# Patient Record
Sex: Female | Born: 1935 | Race: Black or African American | Hispanic: No | Marital: Single | Smoking: Never smoker
Health system: Southern US, Community
[De-identification: ages and names within clinical notes are randomized; demographics above are authoritative.]

## PROBLEM LIST (undated history)

## (undated) DIAGNOSIS — E785 Hyperlipidemia, unspecified: Secondary | ICD-10-CM

## (undated) DIAGNOSIS — E0590 Thyrotoxicosis, unspecified without thyrotoxic crisis or storm: Secondary | ICD-10-CM

## (undated) DIAGNOSIS — R0989 Other specified symptoms and signs involving the circulatory and respiratory systems: Secondary | ICD-10-CM

## (undated) DIAGNOSIS — E042 Nontoxic multinodular goiter: Secondary | ICD-10-CM

## (undated) DIAGNOSIS — K219 Gastro-esophageal reflux disease without esophagitis: Secondary | ICD-10-CM

## (undated) DIAGNOSIS — R002 Palpitations: Secondary | ICD-10-CM

## (undated) DIAGNOSIS — M1990 Unspecified osteoarthritis, unspecified site: Secondary | ICD-10-CM

## (undated) DIAGNOSIS — N183 Chronic kidney disease, stage 3 unspecified: Secondary | ICD-10-CM

## (undated) DIAGNOSIS — I8390 Asymptomatic varicose veins of unspecified lower extremity: Secondary | ICD-10-CM

## (undated) DIAGNOSIS — I10 Essential (primary) hypertension: Secondary | ICD-10-CM

## (undated) HISTORY — DX: Chronic kidney disease, stage 3 unspecified: N18.30

## (undated) HISTORY — DX: Chronic kidney disease, stage 3 (moderate): N18.3

## (undated) HISTORY — DX: Nontoxic multinodular goiter: E04.2

## (undated) HISTORY — DX: Palpitations: R00.2

## (undated) HISTORY — PX: ABDOMINAL HYSTERECTOMY: SHX81

## (undated) HISTORY — DX: Asymptomatic varicose veins of unspecified lower extremity: I83.90

## (undated) HISTORY — DX: Other specified symptoms and signs involving the circulatory and respiratory systems: R09.89

## (undated) HISTORY — PX: OTHER SURGICAL HISTORY: SHX169

## (undated) HISTORY — PX: TONSILLECTOMY: SUR1361

---

## 1999-07-02 ENCOUNTER — Other Ambulatory Visit: Admission: RE | Admit: 1999-07-02 | Discharge: 1999-07-02 | Payer: Self-pay | Admitting: Obstetrics and Gynecology

## 1999-07-29 ENCOUNTER — Encounter: Payer: Self-pay | Admitting: Obstetrics and Gynecology

## 1999-07-29 ENCOUNTER — Encounter: Admission: RE | Admit: 1999-07-29 | Discharge: 1999-07-29 | Payer: Self-pay | Admitting: Obstetrics and Gynecology

## 2000-07-31 ENCOUNTER — Other Ambulatory Visit: Admission: RE | Admit: 2000-07-31 | Discharge: 2000-07-31 | Payer: Self-pay | Admitting: Obstetrics and Gynecology

## 2000-08-11 ENCOUNTER — Encounter: Payer: Self-pay | Admitting: Obstetrics and Gynecology

## 2000-08-11 ENCOUNTER — Encounter: Admission: RE | Admit: 2000-08-11 | Discharge: 2000-08-11 | Payer: Self-pay | Admitting: Obstetrics and Gynecology

## 2000-09-23 ENCOUNTER — Encounter: Payer: Self-pay | Admitting: Obstetrics and Gynecology

## 2000-09-23 ENCOUNTER — Encounter: Admission: RE | Admit: 2000-09-23 | Discharge: 2000-09-23 | Payer: Self-pay | Admitting: Obstetrics and Gynecology

## 2001-08-12 ENCOUNTER — Other Ambulatory Visit: Admission: RE | Admit: 2001-08-12 | Discharge: 2001-08-12 | Payer: Self-pay | Admitting: Obstetrics and Gynecology

## 2002-10-19 ENCOUNTER — Ambulatory Visit (HOSPITAL_COMMUNITY): Admission: RE | Admit: 2002-10-19 | Discharge: 2002-10-19 | Payer: Self-pay | Admitting: Gastroenterology

## 2002-10-19 ENCOUNTER — Encounter (INDEPENDENT_AMBULATORY_CARE_PROVIDER_SITE_OTHER): Payer: Self-pay | Admitting: Specialist

## 2003-08-28 ENCOUNTER — Other Ambulatory Visit: Admission: RE | Admit: 2003-08-28 | Discharge: 2003-08-28 | Payer: Self-pay | Admitting: Obstetrics and Gynecology

## 2003-09-08 ENCOUNTER — Encounter: Admission: RE | Admit: 2003-09-08 | Discharge: 2003-09-08 | Payer: Self-pay | Admitting: Surgery

## 2006-12-08 ENCOUNTER — Encounter: Admission: RE | Admit: 2006-12-08 | Discharge: 2006-12-08 | Payer: Self-pay | Admitting: Obstetrics and Gynecology

## 2008-09-14 ENCOUNTER — Encounter: Admission: RE | Admit: 2008-09-14 | Discharge: 2008-09-14 | Payer: Self-pay | Admitting: Family Medicine

## 2009-03-02 ENCOUNTER — Encounter: Admission: RE | Admit: 2009-03-02 | Discharge: 2009-03-02 | Payer: Self-pay | Admitting: Family Medicine

## 2009-09-05 ENCOUNTER — Encounter: Admission: RE | Admit: 2009-09-05 | Discharge: 2009-09-05 | Payer: Self-pay | Admitting: Endocrinology

## 2010-03-01 ENCOUNTER — Encounter
Admission: RE | Admit: 2010-03-01 | Discharge: 2010-03-01 | Payer: Self-pay | Source: Home / Self Care | Attending: Endocrinology | Admitting: Endocrinology

## 2010-07-05 NOTE — Op Note (Signed)
   NAME:  Tammy Page, Tammy Page                        ACCOUNT NO.:  [id number]   MEDICAL RECORD NO.:  [id number]                   PATIENT TYPE:  AMB   LOCATION:  ENDO                                 FACILITY:  [facility]   PHYSICIAN:  John C. Madilyn Fireman, M.D.                 DATE OF BIRTH:  30-Jan-1933   DATE OF PROCEDURE:  10/19/2002  DATE OF DISCHARGE:                                 OPERATIVE REPORT   PROCEDURE:  Colonoscopy with polypectomy.   INDICATION FOR PROCEDURE:  Colon cancer screening in a 76 year old patient  with no prior screening.   DESCRIPTION OF PROCEDURE:  The patient was placed in the left lateral  decubitus position and placed on the pulse monitor with continuous low-flow  oxygen delivered by nasal cannula.  She was sedated with 62.5 mcg IV  fentanyl and 5 mg IV Versed.  The Olympus video colonoscope was inserted  into the rectum and advanced to the cecum, confirmed by transillumination at  McBurney's point and visualization of the ileocecal valve and appendiceal  orifice.  The prep was excellent.  Just proximal to the ileocecal valve  there was a 1 cm sessile polyp that was removed by snare.  The remainder of  the cecum, ascending, transverse, descending, and sigmoid colon all appeared  normal with no further masses, polyps, diverticula, or other mucosal  abnormalities.  The rectum likewise appeared normal, and retroflexed view of  the anus revealed no obvious internal hemorrhoids.  The scope was then  withdrawn and the patient returned to the recovery room in stable condition.  She tolerated the procedure well, and there were no immediate complications.   IMPRESSION:  Cecal polyp, otherwise normal study.   PLAN:  Await histology to determine method and interval for future colon  screening.                                               John C. Madilyn Fireman, M.D.    [facility]/MEDQ  D:  10/19/2002  T:  10/20/2002  Job:  [id number]   cc:   Duke Salvia. Marcelle Overlie, M.D.  [address], [address] [place]  [place] [address]  Fax: [phone]

## 2010-08-29 ENCOUNTER — Other Ambulatory Visit: Payer: Self-pay | Admitting: Endocrinology

## 2010-08-29 DIAGNOSIS — E049 Nontoxic goiter, unspecified: Secondary | ICD-10-CM

## 2010-09-03 ENCOUNTER — Other Ambulatory Visit: Payer: Self-pay

## 2010-09-05 ENCOUNTER — Ambulatory Visit
Admission: RE | Admit: 2010-09-05 | Discharge: 2010-09-05 | Disposition: A | Discharge status: Home / Self Care | Payer: Medicare Other | Source: Ambulatory Visit | Attending: Endocrinology | Admitting: Endocrinology

## 2010-09-05 DIAGNOSIS — E049 Nontoxic goiter, unspecified: Secondary | ICD-10-CM

## 2010-09-11 ENCOUNTER — Other Ambulatory Visit: Payer: Self-pay | Admitting: Endocrinology

## 2010-09-11 DIAGNOSIS — E042 Nontoxic multinodular goiter: Secondary | ICD-10-CM

## 2010-09-18 ENCOUNTER — Ambulatory Visit
Admission: RE | Admit: 2010-09-18 | Discharge: 2010-09-18 | Disposition: A | Discharge status: Home / Self Care | Payer: Medicare Other | Source: Ambulatory Visit | Attending: Endocrinology | Admitting: Endocrinology

## 2010-09-18 ENCOUNTER — Other Ambulatory Visit (HOSPITAL_COMMUNITY)
Admission: RE | Admit: 2010-09-18 | Discharge: 2010-09-18 | Disposition: A | Discharge status: Home / Self Care | Payer: Medicare Other | Source: Ambulatory Visit | Attending: Interventional Radiology | Admitting: Interventional Radiology

## 2010-09-18 DIAGNOSIS — E042 Nontoxic multinodular goiter: Secondary | ICD-10-CM

## 2010-09-18 DIAGNOSIS — E049 Nontoxic goiter, unspecified: Secondary | ICD-10-CM | POA: Insufficient documentation

## 2010-10-14 ENCOUNTER — Other Ambulatory Visit: Payer: Self-pay | Admitting: Family Medicine

## 2010-10-14 ENCOUNTER — Ambulatory Visit
Admission: RE | Admit: 2010-10-14 | Discharge: 2010-10-14 | Disposition: A | Discharge status: Home / Self Care | Payer: Medicare Other | Source: Ambulatory Visit | Attending: Family Medicine | Admitting: Family Medicine

## 2010-10-14 DIAGNOSIS — R609 Edema, unspecified: Secondary | ICD-10-CM

## 2011-01-24 ENCOUNTER — Other Ambulatory Visit: Payer: Self-pay

## 2011-01-24 ENCOUNTER — Encounter: Payer: Self-pay | Admitting: Physical Medicine and Rehabilitation

## 2011-01-24 ENCOUNTER — Emergency Department (HOSPITAL_COMMUNITY)
Admission: EM | Admit: 2011-01-24 | Discharge: 2011-01-24 | Disposition: A | Discharge status: Home / Self Care | Payer: Medicare Other | Attending: Emergency Medicine | Admitting: Emergency Medicine

## 2011-01-24 DIAGNOSIS — E559 Vitamin D deficiency, unspecified: Secondary | ICD-10-CM | POA: Insufficient documentation

## 2011-01-24 DIAGNOSIS — M79609 Pain in unspecified limb: Secondary | ICD-10-CM | POA: Insufficient documentation

## 2011-01-24 DIAGNOSIS — M1990 Unspecified osteoarthritis, unspecified site: Secondary | ICD-10-CM | POA: Insufficient documentation

## 2011-01-24 DIAGNOSIS — R0602 Shortness of breath: Secondary | ICD-10-CM | POA: Insufficient documentation

## 2011-01-24 DIAGNOSIS — E785 Hyperlipidemia, unspecified: Secondary | ICD-10-CM | POA: Insufficient documentation

## 2011-01-24 DIAGNOSIS — R079 Chest pain, unspecified: Secondary | ICD-10-CM | POA: Insufficient documentation

## 2011-01-24 DIAGNOSIS — R002 Palpitations: Secondary | ICD-10-CM | POA: Insufficient documentation

## 2011-01-24 DIAGNOSIS — I480 Paroxysmal atrial fibrillation: Secondary | ICD-10-CM

## 2011-01-24 DIAGNOSIS — I10 Essential (primary) hypertension: Secondary | ICD-10-CM | POA: Insufficient documentation

## 2011-01-24 DIAGNOSIS — R42 Dizziness and giddiness: Secondary | ICD-10-CM | POA: Insufficient documentation

## 2011-01-24 DIAGNOSIS — I4891 Unspecified atrial fibrillation: Secondary | ICD-10-CM | POA: Insufficient documentation

## 2011-01-24 DIAGNOSIS — M542 Cervicalgia: Secondary | ICD-10-CM | POA: Insufficient documentation

## 2011-01-24 DIAGNOSIS — K219 Gastro-esophageal reflux disease without esophagitis: Secondary | ICD-10-CM | POA: Insufficient documentation

## 2011-01-24 DIAGNOSIS — H409 Unspecified glaucoma: Secondary | ICD-10-CM | POA: Insufficient documentation

## 2011-01-24 HISTORY — DX: Unspecified osteoarthritis, unspecified site: M19.90

## 2011-01-24 HISTORY — DX: Gastro-esophageal reflux disease without esophagitis: K21.9

## 2011-01-24 HISTORY — DX: Hyperlipidemia, unspecified: E78.5

## 2011-01-24 HISTORY — DX: Essential (primary) hypertension: I10

## 2011-01-24 LAB — URINALYSIS, ROUTINE W REFLEX MICROSCOPIC
Bilirubin Urine: NEGATIVE
Glucose, UA: NEGATIVE mg/dL
Hgb urine dipstick: NEGATIVE
Leukocytes, UA: NEGATIVE
Nitrite: NEGATIVE
Specific Gravity, Urine: 1.006 (ref 1.005–1.030)
pH: 6.5 (ref 5.0–8.0)

## 2011-01-24 LAB — BASIC METABOLIC PANEL
Calcium: 11.2 mg/dL — ABNORMAL HIGH (ref 8.4–10.5)
Potassium: 4.3 mEq/L (ref 3.5–5.1)
Sodium: 142 mEq/L (ref 135–145)

## 2011-01-24 LAB — CARDIAC PANEL(CRET KIN+CKTOT+MB+TROPI)
CK, MB: 3 ng/mL (ref 0.3–4.0)
Relative Index: INVALID (ref 0.0–2.5)
Total CK: 62 U/L (ref 7–177)
Troponin I: <0.3 ng/mL (ref <0.30)

## 2011-01-24 LAB — CBC
Hemoglobin: 13 g/dL (ref 12.0–15.0)
MCHC: 32.5 g/dL (ref 30.0–36.0)

## 2011-01-24 MED ORDER — SODIUM CHLORIDE 0.9 % IV BOLUS (SEPSIS)
500.0000 mL | Freq: Once | INTRAVENOUS | Status: AC
  Administered 2011-01-24: 500 mL via INTRAVENOUS

## 2011-01-24 MED ORDER — METOPROLOL TARTRATE 50 MG PO TABS: 25.0000 mg | ORAL_TABLET | Freq: Two times a day (BID) | ORAL | Status: DC

## 2011-01-24 NOTE — ED Notes (Signed)
Discharge instructions reviewed;  Verbalizes understanding.  Pt to lobby via wheelchair.  NAD noted.  VSS.

## 2011-01-24 NOTE — ED Notes (Signed)
Pt presents to department for evaluation of heart palpitations. Onset Wednesday while at home. Pt states "I feel like my heart is beating out of my chest." pt also states generalized weakness and fatigue. Respirations unlabored at the time. She is alert and oriented x4. Skin warm and dry.

## 2011-01-24 NOTE — ED Provider Notes (Signed)
History     CSN: [id number] Arrival date & time: 01/24/2011  4:51 PM   First MD Initiated Contact with Patient 01/24/11 1714      Chief Complaint  Patient presents with  . Chest Pain    (Consider location/radiation/quality/duration/timing/severity/associated sxs/prior treatment) The history is provided by the patient. The history is limited by the condition of the patient.   the patient is a 77 year old female, who presents to the emergency department complaining of palpitations in her chest that began on Wednesday.  She also had right arm pain, and right neck pain, associated with the palpitations.  She felt a little bit lightheaded and shortness of breath, as well.  She denies fainting.  She denies nausea, vomiting, diaphoresis, diarrhea, or urinary tract symptoms.  She denies leg pain or swelling. She does not smoke.  She denies drinking caffeinated beverages.  She has never had an irregular heartbeat in the past or congestive heart failure.  Prior to Wednesday.  She has never felt these symptoms before.  She denies recent illness.  She called her primary care physician, and was told to come to the emergency department for evaluation.  Past Medical History  Diagnosis Date  . Hypertension   . Glaucoma   . GERD (gastroesophageal reflux disease)   . Vitamin D deficiency   . Allergic rhinitis   . Hyperlipemia   . Osteoarthritis     No past surgical history on file.  No family history on file.  History  Substance Use Topics  . Smoking status: Never Smoker   . Smokeless tobacco: Not on file  . Alcohol Use: No    OB History    Grav Para Term Preterm Abortions TAB SAB Ect Mult Living                  Review of Systems  Constitutional: Negative for fever, chills and diaphoresis.  HENT: Negative for congestion and neck pain.   Eyes: Negative for redness.  Respiratory: Positive for shortness of breath. Negative for cough and chest tightness.   Cardiovascular: Positive for  chest pain and palpitations. Negative for leg swelling.  Gastrointestinal: Negative for nausea, vomiting, abdominal pain and diarrhea.  Genitourinary: Negative for dysuria.  Musculoskeletal: Negative for back pain.  Skin: Negative for rash.  Neurological: Positive for light-headedness. Negative for numbness and headaches.  Psychiatric/Behavioral: Negative for confusion.    Allergies  Review of patient's allergies indicates no known allergies.  Home Medications   Current Outpatient Rx  Name Route Sig Dispense Refill  . VITAMIN C 1000 MG PO TABS Oral Take 1,000 mg by mouth daily.      . ASPIRIN 81 MG PO TABS Oral Take 81 mg by mouth daily.     Marland Kitchen CALCIUM CARB-CHOLECALCIFEROL 600-400 MG-UNIT PO TABS Oral Take 1 tablet by mouth 2 (two) times daily.      Marland Kitchen VITAMIN D 1000 UNITS PO TABS Oral Take 2,000 Units by mouth daily.      Marland Kitchen DICLOFENAC SODIUM 1 % TD GEL Topical Apply 1 application topically 4 (four) times daily.        There were no vitals taken for this visit.  Physical Exam  Vitals reviewed. Constitutional: She is oriented to person, place, and time. She appears well-developed and well-nourished.  HENT:  Head: Normocephalic and atraumatic.  Eyes: Pupils are equal, round, and reactive to light.  Neck: Normal range of motion.  Cardiovascular: Regular rhythm and normal heart sounds.   No murmur heard.  Tachycardia  Pulmonary/Chest: Effort normal and breath sounds normal. No respiratory distress. She has no wheezes. She has no rales.  Abdominal: Soft. She exhibits no distension and no mass. There is no tenderness. There is no rebound and no guarding.  Musculoskeletal: Normal range of motion. She exhibits no edema and no tenderness.  Neurological: She is alert and oriented to person, place, and time. No cranial nerve deficit.  Skin: Skin is warm and dry. No rash noted. No erythema.  Psychiatric: She has a normal mood and affect. Her behavior is normal.    ED Course    Procedures (including critical care time)  Paroxysmal atrial fibrillation.  When I walked into the room.  She had a mild tachycardia on the monitor.  However, her heart rate was only 101.  P waves were evident.   Her sinus tachycardia represents a resolution of the atrial fibrillation, which she had on her presenting EKG at 1646.  We will perform laboratory testing, and keep her on the monitor.  There is no intervention indicated at this time.   Labs Reviewed  CBC  BASIC METABOLIC PANEL  URINALYSIS, ROUTINE W REFLEX MICROSCOPIC  CARDIAC PANEL(CRET KIN+CKTOT+MB+TROPI)     ED ECG REPORT   Date: 01/24/2011  EKG Time: 1646 Rate: 116  Rhythm: atrial fibrillation,   Axis: nl  Intervals:none  ST&T Change: no.     Narrative Interpretation:  Atrial fibrillation with rapid ventricular responsene     ED ECG REPORT   Date: 01/24/2011  EKG Time: 1738  Rate: 96  Rhythm: normal sinus rhythm,  Axis: nl  Intervals:none  ST&T Change: none  Narrative Interpretation: normal sinus rhythm            The patient remains in a normal sinus rhythm and asymptomatic. I spoke with the cardiologist, Dr. Dietrich Pates.  He agreed that the patient could go home.  He asked me to start a beta blocker twice a day and order a TSH which I have done.  He will arrange to have her evaluated in the office next Monday      MDM  Paroxysmal atrial fibrillation, resolved.  No signs of cardiac ischemia.  Asymptomatic        Nicholes Stairs, MD 01/24/11 1921

## 2011-01-25 LAB — TSH: TSH: <0.008 u[IU]/mL — ABNORMAL LOW (ref 0.350–4.500)

## 2011-01-26 ENCOUNTER — Telehealth: Payer: Self-pay | Admitting: Cardiology

## 2011-01-26 NOTE — Telephone Encounter (Signed)
Pt seen in ER for palpitations on 01/23/2011.  PAF with RVR documented.  Metoprolol 25 mg BID started and patient advised to call [facility] office on Monday for appointment on Monday or Tuesday.  TSH pending.  She will need an echocardiogram as well.

## 2011-01-28 ENCOUNTER — Encounter: Payer: Self-pay | Admitting: *Deleted

## 2011-01-28 ENCOUNTER — Encounter: Payer: Self-pay | Admitting: Cardiology

## 2011-01-29 ENCOUNTER — Ambulatory Visit (INDEPENDENT_AMBULATORY_CARE_PROVIDER_SITE_OTHER): Payer: Medicare Other | Admitting: Cardiovascular Disease

## 2011-01-29 ENCOUNTER — Encounter: Payer: Self-pay | Admitting: Cardiovascular Disease

## 2011-01-29 VITALS — BP 109/62 | HR 65 | Wt 155.0 lb

## 2011-01-29 DIAGNOSIS — I4891 Unspecified atrial fibrillation: Secondary | ICD-10-CM

## 2011-01-29 DIAGNOSIS — I480 Paroxysmal atrial fibrillation: Secondary | ICD-10-CM | POA: Insufficient documentation

## 2011-01-29 DIAGNOSIS — Z7901 Long term (current) use of anticoagulants: Secondary | ICD-10-CM | POA: Insufficient documentation

## 2011-01-29 DIAGNOSIS — Z5181 Encounter for therapeutic drug level monitoring: Secondary | ICD-10-CM

## 2011-01-29 DIAGNOSIS — E0590 Thyrotoxicosis, unspecified without thyrotoxic crisis or storm: Secondary | ICD-10-CM | POA: Insufficient documentation

## 2011-01-29 MED ORDER — METHIMAZOLE 10 MG PO TABS: 30.0000 mg | ORAL_TABLET | Freq: Every day | ORAL | Status: DC

## 2011-01-29 MED ORDER — RIVAROXABAN 20 MG PO TABS: 20.0000 mg | ORAL_TABLET | Freq: Every day | ORAL | Status: DC

## 2011-01-29 NOTE — Progress Notes (Signed)
Patient ID: Tammy Page, female   DOB: 1933/02/03, 77 y.o.   MRN: [id number] 76 yo referred by Dr Duanne Guess and ER for PAF.  Seen in ER 5 days ago.  Reviewed both ECG;s.  Afib that converted to NSR while in ER.  Patient has had 10 lb weight loss and palpitations over last few months.  Sees Dr Horald Pollen for previous thyroid issues.  Taken off synthroid a couple of months ago for surpressed TSH but TSH in ER was .008 No previous  Personally called [facility] office and she was not in.  Personally called Dr Danella Penton office to facilitate F/U on 12/18  Since could not reach Dr Horald Pollen I spoke with Dr Sharl Ma.  He suggested Tapasole 30mg  in addition to beta blocker.  Will need free T4 drawn at Dr Janus Molder office as well  ROS: Denies fever, malais, weight loss, blurry vision, decreased visual acuity, cough, sputum, SOB, hemoptysis, pleuritic pain, palpitaitons, heartburn, abdominal pain, melena, lower extremity edema, claudication, or rash.  All other systems reviewed and negative   General: Affect appropriate Thin black female HEENT: normal Neck supple with no adenopathy JVP normal no bruits no thyromegaly Lungs clear with no wheezing and good diaphragmatic motion Heart:  S1/S2 no murmur,rub, gallop or click PMI normal Abdomen: benighn, BS positve, no tenderness, no AAA no bruit.  No HSM or HJR Distal pulses intact with no bruits No edema Neuro non-focal  Reflexes are brisk Skin warm and dry No muscular weakness  Medications Current Outpatient Prescriptions  Medication Sig Dispense Refill  . Ascorbic Acid (VITAMIN C) 1000 MG tablet Take 1,000 mg by mouth daily.        Marland Kitchen aspirin 81 MG tablet Take 81 mg by mouth daily.       . Calcium Carb-Cholecalciferol 600-400 MG-UNIT TABS Take 1 tablet by mouth 2 (two) times daily.        . cholecalciferol (VITAMIN D) 1000 UNITS tablet Take 2,000 Units by mouth daily.        . diclofenac sodium (VOLTAREN) 1 % GEL Apply 1 application topically 4 (four) times daily.         . metoprolol (LOPRESSOR) 50 MG tablet Take 0.5 tablets (25 mg total) by mouth 2 (two) times daily.  60 tablet  0    Allergies Review of patient's allergies indicates no known allergies.  Family History: No family history on file.  Social History: History   Social History  . Marital Status: Widowed    Spouse Name: N/A    Number of Children: N/A  . Years of Education: N/A   Occupational History  . Not on file.   Social History Main Topics  . Smoking status: Never Smoker   . Smokeless tobacco: Not on file  . Alcohol Use: No  . Drug Use:   . Sexually Active:    Other Topics Concern  . Not on file   Social History Narrative  . No narrative on file    Electrocardiogram: In ER 12/7 afib that converted to NSR  No other ECG abnormalities  Assessment and Plan

## 2011-01-29 NOTE — Patient Instructions (Addendum)
Your physician has requested that you have an echocardiogram. Echocardiography is a painless test that uses sound waves to create images of your heart. It provides your doctor with information about the size and shape of your heart and how well your heart's chambers and valves are working. This procedure takes approximately one hour. There are no restrictions for this procedure.   Your physician recommends that you schedule a follow-up appointment to see Dr. Eden Emms in 6 weeks.

## 2011-01-29 NOTE — Assessment & Plan Note (Addendum)
Arranged F/U for Dr Horald Pollen  12/18  Personally spoke with her nurses x2.  Phone consult with Dr Sharl Ma.  Start Tapazole 30mg  daily with beta blocker Free T4 with endocrinologist.

## 2011-01-29 NOTE — Assessment & Plan Note (Signed)
High risk of stroke with PAF and hyperthyroid  Will start on Xarelto until thyroid is Rx  No bleeding diathesis.

## 2011-01-29 NOTE — Assessment & Plan Note (Signed)
Continue beta blocker Echo Likely related to hyperthyroidism

## 2011-01-30 ENCOUNTER — Encounter: Payer: Medicare Other | Admitting: Adult Health

## 2011-02-05 ENCOUNTER — Ambulatory Visit (HOSPITAL_COMMUNITY): Payer: Medicare Other | Attending: Cardiology | Admitting: Radiology

## 2011-02-05 DIAGNOSIS — I480 Paroxysmal atrial fibrillation: Secondary | ICD-10-CM

## 2011-02-05 DIAGNOSIS — E0590 Thyrotoxicosis, unspecified without thyrotoxic crisis or storm: Secondary | ICD-10-CM | POA: Insufficient documentation

## 2011-02-05 DIAGNOSIS — I4891 Unspecified atrial fibrillation: Secondary | ICD-10-CM | POA: Insufficient documentation

## 2011-02-05 DIAGNOSIS — I059 Rheumatic mitral valve disease, unspecified: Secondary | ICD-10-CM | POA: Insufficient documentation

## 2011-02-05 DIAGNOSIS — I079 Rheumatic tricuspid valve disease, unspecified: Secondary | ICD-10-CM | POA: Insufficient documentation

## 2011-02-06 ENCOUNTER — Telehealth: Payer: Self-pay | Admitting: Cardiovascular Disease

## 2011-02-06 NOTE — Telephone Encounter (Signed)
PT AWARE  ECHO NOT REVIEWED  AT THIS TIME ONCE MD REVIEWS WILL CALL WITH RESULTS./CY

## 2011-02-06 NOTE — Telephone Encounter (Signed)
New message:  Pt came in and had an EKG yesterday.  Please call with results.

## 2011-02-06 NOTE — Telephone Encounter (Signed)
PT  HAD ECHO NOT EKG ./CY LMTCB .Zack Seal

## 2011-03-03 ENCOUNTER — Telehealth: Payer: Self-pay | Admitting: Cardiovascular Disease

## 2011-03-03 NOTE — Telephone Encounter (Signed)
AFTER READING LAST DICTATION  PT MAY NEEDED  XARELTO  ONLY  SHORT TERM . INFORMED PT TO GET   ENOUGH PILLS TO LAST UNTIL  SEES DR Eden Emms ON 03-13-11 MESSAGE LEFT ON PT'S VOICE MAIL .Zack Seal

## 2011-03-03 NOTE — Telephone Encounter (Signed)
Pt wants to know if she needs to continue the Xarelto, has rx just needs to refill if so, ok to leave message

## 2011-03-10 ENCOUNTER — Other Ambulatory Visit (HOSPITAL_COMMUNITY): Payer: Self-pay | Admitting: Endocrinology

## 2011-03-10 DIAGNOSIS — E0590 Thyrotoxicosis, unspecified without thyrotoxic crisis or storm: Secondary | ICD-10-CM

## 2011-03-13 ENCOUNTER — Encounter: Payer: Self-pay | Admitting: Cardiovascular Disease

## 2011-03-13 ENCOUNTER — Ambulatory Visit (INDEPENDENT_AMBULATORY_CARE_PROVIDER_SITE_OTHER): Payer: Medicare Other | Admitting: Cardiovascular Disease

## 2011-03-13 VITALS — BP 138/76 | HR 60 | Ht 63.5 in | Wt 156.0 lb

## 2011-03-13 DIAGNOSIS — I480 Paroxysmal atrial fibrillation: Secondary | ICD-10-CM

## 2011-03-13 DIAGNOSIS — Z5181 Encounter for therapeutic drug level monitoring: Secondary | ICD-10-CM

## 2011-03-13 DIAGNOSIS — I4891 Unspecified atrial fibrillation: Secondary | ICD-10-CM

## 2011-03-13 DIAGNOSIS — Z7901 Long term (current) use of anticoagulants: Secondary | ICD-10-CM

## 2011-03-13 DIAGNOSIS — E0590 Thyrotoxicosis, unspecified without thyrotoxic crisis or storm: Secondary | ICD-10-CM

## 2011-03-13 NOTE — Assessment & Plan Note (Signed)
Continue Xarelto Reassess in 4 weeks post Iodine ablation

## 2011-03-13 NOTE — Assessment & Plan Note (Signed)
Maint NSR  Continue beta blocker 

## 2011-03-13 NOTE — Progress Notes (Signed)
78 yo referred by Dr Duanne Guess and ER for PAF on 12/12 .Marland Kitchen Reviewed both ECG;s. Afib that converted to NSR while in ER. Patient has had 10 lb weight loss and palpitations over last few months. Sees Dr Horald Pollen for previous thyroid issues. Taken off synthroid  In October  for surpressed TSH but TSH in ER was .008   Personally called Dr Chauncy Passy office and she was not in. Personally called Dr Danella Penton office to facilitate  I started her on Tapazole in December and she was to have endocrine F/U. Needed anticoagulation with xarelto until TSH normal  She seems to indicate that she is having radioactive iodine Rx next week.    TSH 01/24/11 was still supressed at .008   ROS: Denies fever, malais, weight loss, blurry vision, decreased visual acuity, cough, sputum, SOB, hemoptysis, pleuritic pain, palpitaitons, heartburn, abdominal pain, melena, lower extremity edema, claudication, or rash.  All other systems reviewed and negative  General: Affect appropriate Healthy:  appears stated age HEENT: normal Neck supple with no adenopathy JVP normal no bruits no thyromegaly Lungs clear with no wheezing and good diaphragmatic motion Heart:  S1/S2 no murmur, no rub, gallop or click PMI normal Abdomen: benighn, BS positve, no tenderness, no AAA no bruit.  No HSM or HJR Distal pulses intact with no bruits No edema Neuro non-focal Skin warm and dry No muscular weakness   Current Outpatient Prescriptions  Medication Sig Dispense Refill  . Ascorbic Acid (VITAMIN C) 1000 MG tablet Take 1,000 mg by mouth daily.        Marland Kitchen aspirin 81 MG tablet Take 81 mg by mouth daily.       . Calcium Carb-Cholecalciferol 600-400 MG-UNIT TABS Take 1 tablet by mouth 2 (two) times daily.        . cholecalciferol (VITAMIN D) 1000 UNITS tablet Take 2,000 Units by mouth daily.        . diclofenac sodium (VOLTAREN) 1 % GEL Apply 1 application topically 4 (four) times daily.        . metoprolol (LOPRESSOR) 50 MG tablet Take 0.5 tablets (25 mg  total) by mouth 2 (two) times daily.  60 tablet  0  . Rivaroxaban (XARELTO) 20 MG TABS Take 20 mg by mouth daily.  30 tablet  11    Allergies  Review of patient's allergies indicates no known allergies.  Electrocardiogram:  NSR rate 66 LAE otherwise normal  Assessment and Plan

## 2011-03-13 NOTE — Patient Instructions (Signed)
Your physician recommends that you schedule a follow-up appointment in: 3-4 weeks with Dr. Eden Emms.

## 2011-03-13 NOTE — Assessment & Plan Note (Signed)
Will try to get records from [facility]  ? Tapazole stopped in anticipation of Radioactive I*.  Continue Xarelto and beta blocker until euthyroid

## 2011-03-19 ENCOUNTER — Encounter (HOSPITAL_COMMUNITY)
Admission: RE | Admit: 2011-03-19 | Discharge: 2011-03-19 | Disposition: A | Discharge status: Home / Self Care | Payer: Medicare Other | Source: Ambulatory Visit | Attending: Endocrinology | Admitting: Endocrinology

## 2011-03-19 DIAGNOSIS — E0590 Thyrotoxicosis, unspecified without thyrotoxic crisis or storm: Secondary | ICD-10-CM

## 2011-03-20 ENCOUNTER — Encounter (HOSPITAL_COMMUNITY): Payer: Self-pay

## 2011-03-20 ENCOUNTER — Encounter (HOSPITAL_COMMUNITY)
Admission: RE | Admit: 2011-03-20 | Discharge: 2011-03-20 | Disposition: A | Discharge status: Home / Self Care | Payer: Medicare Other | Source: Ambulatory Visit | Attending: Endocrinology | Admitting: Endocrinology

## 2011-03-20 DIAGNOSIS — E0590 Thyrotoxicosis, unspecified without thyrotoxic crisis or storm: Secondary | ICD-10-CM | POA: Insufficient documentation

## 2011-03-20 HISTORY — DX: Thyrotoxicosis, unspecified without thyrotoxic crisis or storm: E05.90

## 2011-03-20 MED ORDER — SODIUM PERTECHNETATE TC 99M INJECTION
10.8000 | Freq: Once | INTRAVENOUS | Status: AC | PRN
  Administered 2011-03-20: 10.8 via INTRAVENOUS

## 2011-03-20 MED ORDER — SODIUM IODIDE I 131 CAPSULE
9.6000 | Freq: Once | INTRAVENOUS | Status: AC | PRN
  Administered 2011-03-19: 9.6 via ORAL

## 2011-03-27 ENCOUNTER — Other Ambulatory Visit (HOSPITAL_COMMUNITY): Payer: Self-pay | Admitting: Endocrinology

## 2011-03-27 DIAGNOSIS — E0500 Thyrotoxicosis with diffuse goiter without thyrotoxic crisis or storm: Secondary | ICD-10-CM

## 2011-03-28 ENCOUNTER — Other Ambulatory Visit: Payer: Self-pay | Admitting: *Deleted

## 2011-03-28 ENCOUNTER — Telehealth: Payer: Self-pay | Admitting: *Deleted

## 2011-03-28 NOTE — Telephone Encounter (Signed)
PHARMACISTS  NOTIFIED TO CALL PMD FOR  REFILLS ON  METHIMAZOLE 10 MG ./CY

## 2011-04-04 ENCOUNTER — Encounter (HOSPITAL_COMMUNITY)
Admission: RE | Admit: 2011-04-04 | Discharge: 2011-04-04 | Disposition: A | Discharge status: Home / Self Care | Payer: Medicare Other | Source: Ambulatory Visit | Attending: Endocrinology | Admitting: Endocrinology

## 2011-04-04 ENCOUNTER — Telehealth: Payer: Self-pay | Admitting: Cardiovascular Disease

## 2011-04-04 DIAGNOSIS — E0500 Thyrotoxicosis with diffuse goiter without thyrotoxic crisis or storm: Secondary | ICD-10-CM

## 2011-04-04 DIAGNOSIS — E0590 Thyrotoxicosis, unspecified without thyrotoxic crisis or storm: Secondary | ICD-10-CM | POA: Insufficient documentation

## 2011-04-04 MED ORDER — SODIUM IODIDE I 131 CAPSULE
15.7000 | Freq: Once | INTRAVENOUS | Status: AC | PRN
  Administered 2011-04-04: 15.7 via ORAL

## 2011-04-04 NOTE — Telephone Encounter (Signed)
New Msg: Pt calling wanting to speak with nurse/MD regarding pt going in today for thyroid procedure. Pt wants to know if she needs to continue to use blood thinner and heart pill. Pt has to go in for thyroid procedure at 1:45pm.Please return pt call to discuss further before pt procedure if possible.

## 2011-04-04 NOTE — Telephone Encounter (Signed)
Patient having procedure at [facility] that does not involve any cutting per patient.  Advised she should take her medications as directed unless she was told otherwise by MD doing procedure

## 2011-04-10 ENCOUNTER — Ambulatory Visit: Payer: Medicare Other | Admitting: Cardiovascular Disease

## 2011-04-29 ENCOUNTER — Encounter: Payer: Self-pay | Admitting: Cardiovascular Disease

## 2011-05-05 ENCOUNTER — Telehealth: Payer: Self-pay | Admitting: Cardiovascular Disease

## 2011-05-05 NOTE — Telephone Encounter (Signed)
Fu call °Patient returning your call °

## 2011-05-05 NOTE — Telephone Encounter (Signed)
N/A.  LMTC. 

## 2011-05-05 NOTE — Telephone Encounter (Signed)
Pt calling re missed appt letter, she said that appt should have been cxl due to a thyroid problem, but she said she didn't call to cxl  because she never made it ,  when she was here for her appt 03-13-11 the computer shows it was made at checkout at 1131am, so now she wants to know when should she be seen?

## 2011-05-05 NOTE — Telephone Encounter (Signed)
Left message that she needed a 4 week follow up after last visit and to just schedule when she calls back

## 2011-06-04 ENCOUNTER — Ambulatory Visit: Payer: Medicare Other | Admitting: Cardiovascular Disease

## 2011-06-12 ENCOUNTER — Encounter: Payer: Self-pay | Admitting: *Deleted

## 2011-06-12 ENCOUNTER — Encounter: Payer: Self-pay | Admitting: Cardiovascular Disease

## 2011-06-12 ENCOUNTER — Ambulatory Visit (INDEPENDENT_AMBULATORY_CARE_PROVIDER_SITE_OTHER): Payer: Medicare Other | Admitting: Cardiovascular Disease

## 2011-06-12 VITALS — BP 136/86 | HR 53 | Wt 164.0 lb

## 2011-06-12 DIAGNOSIS — E0590 Thyrotoxicosis, unspecified without thyrotoxic crisis or storm: Secondary | ICD-10-CM

## 2011-06-12 DIAGNOSIS — I4891 Unspecified atrial fibrillation: Secondary | ICD-10-CM

## 2011-06-12 DIAGNOSIS — Z7901 Long term (current) use of anticoagulants: Secondary | ICD-10-CM

## 2011-06-12 DIAGNOSIS — I480 Paroxysmal atrial fibrillation: Secondary | ICD-10-CM

## 2011-06-12 DIAGNOSIS — Z5181 Encounter for therapeutic drug level monitoring: Secondary | ICD-10-CM

## 2011-06-12 NOTE — Patient Instructions (Signed)
Your physician wants you to follow-up in:  6 MONTHS WITH DR NISHAN  You will receive a reminder letter in the mail two months in advance. If you don't receive a letter, please call our office to schedule the follow-up appointment. Your physician recommends that you continue on your current medications as directed. Please refer to the Current Medication list given to you today. 

## 2011-06-12 NOTE — Assessment & Plan Note (Signed)
In NSR  D/C xarelto if TSH normal

## 2011-06-12 NOTE — Assessment & Plan Note (Signed)
F/U Dr Romero Belling  Daughter will be with her.  She looks much better and less asthenic

## 2011-06-12 NOTE — Progress Notes (Signed)
Patient ID: Tammy Page, female   DOB: 1932/03/31, 78 y.o.   MRN: [id number] 78 yo referred by Dr Duanne Guess and ER for PAF on 12/12 .Marland Kitchen Reviewed both ECG;s. Afib that converted to NSR while in ER. Patient has had 10 lb weight loss and palpitations over last few months. Sees Dr Horald Pollen for previous thyroid issues. Taken off synthroid In October for surpressed TSH but TSH in ER was .008  Last visit we arranged F/U  She indicates havng radioactive Iodine Rx.  She has gained 15 lbs back.  She thinks her thyroid is normal now  She is seeing Balin today.  I told her to have her xarelto and metroprolol stopped if TSH normal  ROS: Denies fever, malais, weight loss, blurry vision, decreased visual acuity, cough, sputum, SOB, hemoptysis, pleuritic pain, palpitaitons, heartburn, abdominal pain, melena, lower extremity edema, claudication, or rash.  All other systems reviewed and negative  General: Affect appropriate Healthy:  appears stated age HEENT: normal Neck supple with no adenopathy JVP normal no bruits no thyromegaly Lungs clear with no wheezing and good diaphragmatic motion Heart:  S1/S2 no murmur, no rub, gallop or click PMI normal Abdomen: benighn, BS positve, no tenderness, no AAA no bruit.  No HSM or HJR Distal pulses intact with no bruits No edema Neuro non-focal Skin warm and dry No muscular weakness   Current Outpatient Prescriptions  Medication Sig Dispense Refill  . Ascorbic Acid (VITAMIN C) 1000 MG tablet Take 1,000 mg by mouth daily.        Marland Kitchen aspirin 81 MG tablet Take 81 mg by mouth daily.       . Calcium Carb-Cholecalciferol 600-400 MG-UNIT TABS Take 1 tablet by mouth 2 (two) times daily.        . cholecalciferol (VITAMIN D) 1000 UNITS tablet Take 2,000 Units by mouth daily.        . diclofenac sodium (VOLTAREN) 1 % GEL Apply 1 application topically 4 (four) times daily.        . metoprolol (LOPRESSOR) 50 MG tablet Take 50 mg by mouth 2 (two) times daily.      . Rivaroxaban  (XARELTO) 20 MG TABS Take 20 mg by mouth daily.  30 tablet  11  . DISCONTD: metoprolol (LOPRESSOR) 50 MG tablet Take 0.5 tablets (25 mg total) by mouth 2 (two) times daily.  60 tablet  0    Allergies  Review of patient's allergies indicates no known allergies.  Electrocardiogram:  NSR rate 66 LAE  Done 03/13/11  Assessment and Plan

## 2011-06-12 NOTE — Assessment & Plan Note (Signed)
Maint NSR  Related to hyperthyroidism  Stop metoprolol and xarelto if euthyroid.

## 2011-10-16 ENCOUNTER — Other Ambulatory Visit: Payer: Self-pay | Admitting: Obstetrics and Gynecology

## 2012-03-26 ENCOUNTER — Encounter (HOSPITAL_COMMUNITY): Payer: Self-pay | Admitting: Pharmacy Technician

## 2012-04-02 ENCOUNTER — Inpatient Hospital Stay (HOSPITAL_COMMUNITY): Admission: RE | Admit: 2012-04-02 | Payer: Medicare Other | Source: Ambulatory Visit

## 2012-04-05 NOTE — Patient Instructions (Signed)
JAMA MCMILLER  04/05/2012   Your procedure is scheduled on:  04/07/12   Report to [facility] at   1130 AM.  Call this number if you have problems the morning of surgery: [phone]   Remember:             May have clear liquids until 0730am then npo.    Do not eat food after midnite.     Take these medicines the morning of surgery with A SIP OF WATER:    Do not wear jewelry, make-up or nail polish.  Do not wear lotions, powders, or perfumes.   Do not shave 48 hours prior to surgery.   Do not bring valuables to the hospital.  Contacts, dentures or bridgework may not be worn into surgery.  Leave suitcase in the car. After surgery it may be brought to your room.  For patients admitted to the hospital, checkout time is 11:00 AM the day of  discharge.    SEE CHG INSTRUCTION SHEET    Please read over the following fact sheets that you were given: MRSA Information, coughing and deep breathing exercises, leg exercises, Incentive Spirometry Fact Sheet                Failure to comply with these instructions may result in cancellation of your surgery.                Patient Signature ____________________________              Nurse Signature _____________________________

## 2012-04-06 ENCOUNTER — Encounter (HOSPITAL_COMMUNITY): Payer: Self-pay

## 2012-04-06 ENCOUNTER — Ambulatory Visit (HOSPITAL_COMMUNITY)
Admission: RE | Admit: 2012-04-06 | Discharge: 2012-04-06 | Disposition: A | Discharge status: Home / Self Care | Payer: Medicare Other | Source: Ambulatory Visit | Attending: Surgical | Admitting: Surgical

## 2012-04-06 ENCOUNTER — Encounter (HOSPITAL_COMMUNITY)
Admission: RE | Admit: 2012-04-06 | Discharge: 2012-04-06 | Disposition: A | Discharge status: Home / Self Care | Payer: Medicare Other | Source: Ambulatory Visit | Attending: Orthopedic Surgery | Admitting: Orthopedic Surgery

## 2012-04-06 DIAGNOSIS — Z01812 Encounter for preprocedural laboratory examination: Secondary | ICD-10-CM | POA: Insufficient documentation

## 2012-04-06 DIAGNOSIS — Z90710 Acquired absence of both cervix and uterus: Secondary | ICD-10-CM | POA: Insufficient documentation

## 2012-04-06 DIAGNOSIS — R599 Enlarged lymph nodes, unspecified: Secondary | ICD-10-CM | POA: Insufficient documentation

## 2012-04-06 DIAGNOSIS — R350 Frequency of micturition: Secondary | ICD-10-CM | POA: Insufficient documentation

## 2012-04-06 DIAGNOSIS — Z79899 Other long term (current) drug therapy: Secondary | ICD-10-CM | POA: Insufficient documentation

## 2012-04-06 DIAGNOSIS — R252 Cramp and spasm: Secondary | ICD-10-CM | POA: Insufficient documentation

## 2012-04-06 DIAGNOSIS — R12 Heartburn: Secondary | ICD-10-CM | POA: Insufficient documentation

## 2012-04-06 DIAGNOSIS — M7989 Other specified soft tissue disorders: Secondary | ICD-10-CM | POA: Insufficient documentation

## 2012-04-06 DIAGNOSIS — M75120 Complete rotator cuff tear or rupture of unspecified shoulder, not specified as traumatic: Secondary | ICD-10-CM | POA: Insufficient documentation

## 2012-04-06 DIAGNOSIS — Z9089 Acquired absence of other organs: Secondary | ICD-10-CM | POA: Insufficient documentation

## 2012-04-06 DIAGNOSIS — Z7982 Long term (current) use of aspirin: Secondary | ICD-10-CM | POA: Insufficient documentation

## 2012-04-06 DIAGNOSIS — IMO0001 Reserved for inherently not codable concepts without codable children: Secondary | ICD-10-CM | POA: Insufficient documentation

## 2012-04-06 DIAGNOSIS — I10 Essential (primary) hypertension: Secondary | ICD-10-CM | POA: Insufficient documentation

## 2012-04-06 LAB — SURGICAL PCR SCREEN: MRSA, PCR: NEGATIVE

## 2012-04-06 LAB — COMPREHENSIVE METABOLIC PANEL
ALT: 19 U/L (ref 0–35)
AST: 18 U/L (ref 0–37)
Albumin: 3.9 g/dL (ref 3.5–5.2)
Alkaline Phosphatase: 72 U/L (ref 39–117)
BUN: 14 mg/dL (ref 6–23)
CO2: 30 mEq/L (ref 19–32)
Calcium: 9.7 mg/dL (ref 8.4–10.5)
Chloride: 100 mEq/L (ref 96–112)
Creatinine, Ser: 1.04 mg/dL (ref 0.50–1.10)
GFR calc Af Amer: 59 mL/min — ABNORMAL LOW (ref >90)
GFR calc non Af Amer: 50 mL/min — ABNORMAL LOW (ref >90)
Glucose, Bld: 88 mg/dL (ref 70–99)
Potassium: 4.4 mEq/L (ref 3.5–5.1)
Sodium: 138 mEq/L (ref 135–145)
Total Bilirubin: 0.3 mg/dL (ref 0.3–1.2)
Total Protein: 7.6 g/dL (ref 6.0–8.3)

## 2012-04-06 LAB — CBC
MCH: 30.5 pg (ref 26.0–34.0)
Platelets: 204 10*3/uL (ref 150–400)
RBC: 4.63 MIL/uL (ref 3.87–5.11)
RDW: 13 % (ref 11.5–15.5)
WBC: 5.1 10*3/uL (ref 4.0–10.5)

## 2012-04-06 LAB — URINALYSIS, ROUTINE W REFLEX MICROSCOPIC
Bilirubin Urine: NEGATIVE
Glucose, UA: NEGATIVE mg/dL
Hgb urine dipstick: NEGATIVE
Ketones, ur: NEGATIVE mg/dL
Leukocytes, UA: NEGATIVE
Nitrite: NEGATIVE
Protein, ur: NEGATIVE mg/dL
Specific Gravity, Urine: 1.006 (ref 1.005–1.030)
Urobilinogen, UA: 0.2 mg/dL (ref 0.0–1.0)
pH: 7.5 (ref 5.0–8.0)

## 2012-04-06 LAB — PROTIME-INR
INR: 1 (ref 0.00–1.49)
Prothrombin Time: 13.1 seconds (ref 11.6–15.2)

## 2012-04-06 LAB — APTT: aPTT: 30 seconds (ref 24–37)

## 2012-04-06 NOTE — Progress Notes (Signed)
Last office visit with Dr Eden Emms 03/13/11 [facility]  EKG 03/29/12 on chart  02/05/11 ECHO in [facility]

## 2012-04-06 NOTE — Progress Notes (Signed)
PCR results faxed via EPIC to Dr Darrelyn Hillock.

## 2012-04-06 NOTE — H&P (Signed)
Tammy Page DOB: 1932-11-08  Chief Complaint: right shoulder pain  History of Present Illness The patient is a 79 year old female who presents with shoulder complaints. They are right handed and present today reporting pain, catching, pain with overhead motions, pain with reaching and pain with lifting at the right shoulder and right superior shoulder that began 1 month ago. The patient reports that the shoulder symptoms began without any known injury. The onset of symptoms was gradual. The patient reports symptoms which include shoulder pain, catching, decreased range of motion, night pain, inability to lay on that side and neck pain. The patient reports symptoms that radiate to the right upper arm. The patient describes these symptoms as mild and worsening. Current treatment includes nonsteroidal anti-inflammatory drugs. She had onset of pain while lifting something over head on a shelf. The next day she had increased pain, which has not been helped with Ibuprofen. MRI showed a torn rotator cuff in the right shoulder.    Past Medical History Hypertension   Allergies No Known Drug Allergies.    Family History Cerebrovascular Accident. mother and father Congestive Heart Failure. brother Diabetes Mellitus. brother Hypertension. mother and father   Social History Alcohol use. never consumed alcohol Children. 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 single Tobacco / smoke exposure. no Tobacco use. never smoker   Medication History Multiple Vitamin (1 (one) Oral) Active. Aspirin (81MG  Tablet, 1 (one) Oral) Active. Iron Supplement ( Oral) Specific dose unknown - Active. Levothyroxine Sodium ( Tablet, Oral) Active. Voltaren (1% Gel, Transdermal as needed) Active.   Past Surgical History Cataract Surgery. right Hysterectomy. partial (non-cancerous) Thyroidectomy; Total Tonsillectomy    Review of  Systems General:Present- Weight Gain. Not Present- Chills, Fever, Night Sweats, Appetite Loss, Fatigue, Feeling sick and Weight Loss. Skin:Present- Change in Hair or Nails. Not Present- Itching, Rash, Skin Color Changes, Ulcer and Psoriasis. HEENT:Present- Sensitivity to light. Not Present- Hearing problems, Nose Bleed and Ringing in the Ears. Neck:Present- Swollen Glands. Not Present- Neck Mass. Respiratory:Not Present- Snoring, Chronic Cough, Bloody sputum and Dyspnea. Cardiovascular:Present- Swelling of Extremities and Leg Cramps. Not Present- Shortness of Breath, Chest Pain and Palpitations. Gastrointestinal:Present- Heartburn. Not Present- Bloody Stool, Abdominal Pain, Vomiting, Nausea and Incontinence of Stool. Female Genitourinary:Present- Frequency and Nocturia. Not Present- Blood in Urine, Menstrual Irregularities and Incontinence. Musculoskeletal:Present- Muscle Pain, Joint Stiffness, Joint Swelling, Joint Pain and Back Pain. Not Present- Muscle Weakness. Neurological:Not Present- Tingling, Numbness, Burning, Tremor, Headaches and Dizziness. Psychiatric:Not Present- Anxiety, Depression and Memory Loss. Endocrine:Not Present- Cold Intolerance, Heat Intolerance, Excessive hunger and Excessive Thirst. Hematology:Not Present- Abnormal Bleeding, Anemia, Blood Clots and Easy Bruising.   Vitals Weight: 173 lb Height: 64 in Body Surface Area: 1.88 m Body Mass Index: 29.7 kg/m Pulse: 75 (Regular) BP: 148/82 (Sitting, Left Arm, Standard)   Physical Exam Exam today shows pain and marked limited motion of her right shoulder in regards to abduction. Shoulder flexion and extension is also limited because of pain. She has subdeltoid lipoma. The scapula is normal. Biceps and triceps are intact. The elbow is normal. She has good circulation in her hand. Good function in her hand. Heart sounds normal. No murmurs. RRR. Lungs clear to auscultation. Abdomen soft and  nontender. Bowel sounds active. EOM intact. Neck supple.     RADIOGRAPHS: X-rays of her right shoulder are negative.    Assessment & Plan Complete rotator cuff rupture, non-traumatic (727.61) The plan is to do an open acromionectomy and rotator cuff repair on  the right. Excise subdeltoid lipoma. This will be done under general anesthesia. The patient is not permitted to eat or drink the night before surgery. Complications are rare such as infection, frozen shoulder etc. The patient will be on antibiotic preoperatively right before surgery. We may need to use a graft which is a TissueMend graft out of calf skin and we may need to use an anchor which is plastic which would be left in the bone. Risks and benefits of the surgery discussed with the patient by Dr. Darrelyn Hillock.      Dimitri Ped, PA-C

## 2012-04-07 ENCOUNTER — Observation Stay (HOSPITAL_COMMUNITY)
Admission: RE | Admit: 2012-04-07 | Discharge: 2012-04-08 | Disposition: A | Discharge status: Home / Self Care | Payer: Medicare Other | Source: Ambulatory Visit | Attending: Orthopedic Surgery | Admitting: Orthopedic Surgery

## 2012-04-07 ENCOUNTER — Encounter (HOSPITAL_COMMUNITY)
Admission: RE | Disposition: A | Discharge status: Home / Self Care | Payer: Self-pay | Source: Ambulatory Visit | Attending: Orthopedic Surgery

## 2012-04-07 ENCOUNTER — Encounter (HOSPITAL_COMMUNITY): Payer: Self-pay | Admitting: *Deleted

## 2012-04-07 ENCOUNTER — Encounter (HOSPITAL_COMMUNITY): Payer: Self-pay | Admitting: Anesthesiology

## 2012-04-07 ENCOUNTER — Ambulatory Visit (HOSPITAL_COMMUNITY): Payer: Medicare Other | Admitting: Anesthesiology

## 2012-04-07 DIAGNOSIS — Z79899 Other long term (current) drug therapy: Secondary | ICD-10-CM | POA: Insufficient documentation

## 2012-04-07 DIAGNOSIS — Z7982 Long term (current) use of aspirin: Secondary | ICD-10-CM | POA: Insufficient documentation

## 2012-04-07 DIAGNOSIS — M75121 Complete rotator cuff tear or rupture of right shoulder, not specified as traumatic: Secondary | ICD-10-CM | POA: Diagnosis present

## 2012-04-07 DIAGNOSIS — K219 Gastro-esophageal reflux disease without esophagitis: Secondary | ICD-10-CM | POA: Insufficient documentation

## 2012-04-07 DIAGNOSIS — Z01812 Encounter for preprocedural laboratory examination: Secondary | ICD-10-CM | POA: Insufficient documentation

## 2012-04-07 DIAGNOSIS — I4891 Unspecified atrial fibrillation: Secondary | ICD-10-CM | POA: Insufficient documentation

## 2012-04-07 DIAGNOSIS — M25519 Pain in unspecified shoulder: Secondary | ICD-10-CM | POA: Insufficient documentation

## 2012-04-07 DIAGNOSIS — M25819 Other specified joint disorders, unspecified shoulder: Secondary | ICD-10-CM | POA: Insufficient documentation

## 2012-04-07 DIAGNOSIS — M75120 Complete rotator cuff tear or rupture of unspecified shoulder, not specified as traumatic: Principal | ICD-10-CM | POA: Insufficient documentation

## 2012-04-07 DIAGNOSIS — I10 Essential (primary) hypertension: Secondary | ICD-10-CM | POA: Insufficient documentation

## 2012-04-07 DIAGNOSIS — E0590 Thyrotoxicosis, unspecified without thyrotoxic crisis or storm: Secondary | ICD-10-CM | POA: Insufficient documentation

## 2012-04-07 DIAGNOSIS — D1779 Benign lipomatous neoplasm of other sites: Secondary | ICD-10-CM | POA: Insufficient documentation

## 2012-04-07 HISTORY — PX: SHOULDER OPEN ROTATOR CUFF REPAIR: SHX2407

## 2012-04-07 SURGERY — REPAIR, ROTATOR CUFF, OPEN
Anesthesia: General | Site: Shoulder | Laterality: Right | Wound class: Clean

## 2012-04-07 MED ORDER — HYDROMORPHONE HCL PF 1 MG/ML IJ SOLN
0.2500 mg | INTRAMUSCULAR | Status: DC | PRN
  Administered 2012-04-07 (×2): 0.5 mg via INTRAVENOUS

## 2012-04-07 MED ORDER — PHENOL 1.4 % MT LIQD: 1.0000 | OROMUCOSAL | Status: DC | PRN

## 2012-04-07 MED ORDER — METOCLOPRAMIDE HCL 5 MG PO TABS
5.0000 mg | ORAL_TABLET | Freq: Three times a day (TID) | ORAL | Status: DC | PRN
  Filled 2012-04-07: qty 2

## 2012-04-07 MED ORDER — ONDANSETRON HCL 4 MG/2ML IJ SOLN: 4.0000 mg | Freq: Four times a day (QID) | INTRAMUSCULAR | Status: DC | PRN

## 2012-04-07 MED ORDER — BISACODYL 10 MG RE SUPP: 10.0000 mg | Freq: Every day | RECTAL | Status: DC | PRN

## 2012-04-07 MED ORDER — PROPOFOL 10 MG/ML IV BOLUS
INTRAVENOUS | Status: DC | PRN
  Administered 2012-04-07: 140 mg via INTRAVENOUS

## 2012-04-07 MED ORDER — DEXAMETHASONE SODIUM PHOSPHATE 10 MG/ML IJ SOLN
INTRAMUSCULAR | Status: DC | PRN
  Administered 2012-04-07: 10 mg via INTRAVENOUS

## 2012-04-07 MED ORDER — BUPIVACAINE LIPOSOME 1.3 % IJ SUSP
20.0000 mL | Freq: Once | INTRAMUSCULAR | Status: AC
  Administered 2012-04-07: 15 mL
  Filled 2012-04-07: qty 20

## 2012-04-07 MED ORDER — TRAVOPROST (BAK FREE) 0.004 % OP SOLN
1.0000 [drp] | Freq: Every day | OPHTHALMIC | Status: DC
  Administered 2012-04-07: 1 [drp] via OPHTHALMIC
  Filled 2012-04-07: qty 2.5

## 2012-04-07 MED ORDER — LIDOCAINE HCL 4 % MT SOLN
OROMUCOSAL | Status: DC | PRN
  Administered 2012-04-07: 4 mL via TOPICAL

## 2012-04-07 MED ORDER — FLEET ENEMA 7-19 GM/118ML RE ENEM: 1.0000 | ENEMA | Freq: Once | RECTAL | Status: AC | PRN

## 2012-04-07 MED ORDER — LACTATED RINGERS IV SOLN: INTRAVENOUS | Status: DC

## 2012-04-07 MED ORDER — POLYETHYLENE GLYCOL 3350 17 G PO PACK
17.0000 g | PACK | Freq: Every day | ORAL | Status: DC | PRN
  Filled 2012-04-07: qty 1

## 2012-04-07 MED ORDER — HETASTARCH-ELECTROLYTES 6 % IV SOLN
INTRAVENOUS | Status: DC | PRN
  Administered 2012-04-07: 14:00:00 via INTRAVENOUS

## 2012-04-07 MED ORDER — THROMBIN 5000 UNITS EX SOLR
CUTANEOUS | Status: AC
  Filled 2012-04-07: qty 5000

## 2012-04-07 MED ORDER — HYDROMORPHONE HCL PF 1 MG/ML IJ SOLN
INTRAMUSCULAR | Status: AC
  Filled 2012-04-07: qty 1

## 2012-04-07 MED ORDER — MIDAZOLAM HCL 5 MG/5ML IJ SOLN
INTRAMUSCULAR | Status: DC | PRN
  Administered 2012-04-07: 1 mg via INTRAVENOUS

## 2012-04-07 MED ORDER — ACETAMINOPHEN 10 MG/ML IV SOLN
INTRAVENOUS | Status: AC
  Filled 2012-04-07: qty 100

## 2012-04-07 MED ORDER — LEVOTHYROXINE SODIUM 88 MCG PO TABS
88.0000 ug | ORAL_TABLET | Freq: Every day | ORAL | Status: DC
  Administered 2012-04-08: 88 ug via ORAL
  Filled 2012-04-07 (×2): qty 1

## 2012-04-07 MED ORDER — THROMBIN 5000 UNITS EX SOLR
CUTANEOUS | Status: DC | PRN
  Administered 2012-04-07: 5000 [IU] via TOPICAL

## 2012-04-07 MED ORDER — LACTATED RINGERS IV SOLN
INTRAVENOUS | Status: DC | PRN
  Administered 2012-04-07: 13:00:00 via INTRAVENOUS

## 2012-04-07 MED ORDER — METHOCARBAMOL 500 MG PO TABS
500.0000 mg | ORAL_TABLET | Freq: Four times a day (QID) | ORAL | Status: DC | PRN
  Administered 2012-04-08 (×2): 500 mg via ORAL
  Filled 2012-04-07 (×2): qty 1

## 2012-04-07 MED ORDER — MENTHOL 3 MG MT LOZG: 1.0000 | LOZENGE | OROMUCOSAL | Status: DC | PRN

## 2012-04-07 MED ORDER — BRIMONIDINE TARTRATE 0.2 % OP SOLN
1.0000 [drp] | Freq: Two times a day (BID) | OPHTHALMIC | Status: DC
  Administered 2012-04-08: 1 [drp] via OPHTHALMIC
  Filled 2012-04-07: qty 5

## 2012-04-07 MED ORDER — LIDOCAINE HCL (CARDIAC) 20 MG/ML IV SOLN
INTRAVENOUS | Status: DC | PRN
  Administered 2012-04-07: 60 mg via INTRAVENOUS

## 2012-04-07 MED ORDER — DEXTROSE 5 % IV SOLN
500.0000 mg | Freq: Four times a day (QID) | INTRAVENOUS | Status: DC | PRN
  Administered 2012-04-07: 500 mg via INTRAVENOUS
  Filled 2012-04-07: qty 5

## 2012-04-07 MED ORDER — CEFAZOLIN SODIUM 1-5 GM-% IV SOLN
1.0000 g | Freq: Four times a day (QID) | INTRAVENOUS | Status: AC
  Administered 2012-04-07 – 2012-04-08 (×3): 1 g via INTRAVENOUS
  Filled 2012-04-07 (×3): qty 50

## 2012-04-07 MED ORDER — FENTANYL CITRATE 0.05 MG/ML IJ SOLN
INTRAMUSCULAR | Status: DC | PRN
  Administered 2012-04-07 (×3): 50 ug via INTRAVENOUS
  Administered 2012-04-07: 100 ug via INTRAVENOUS

## 2012-04-07 MED ORDER — ONDANSETRON HCL 4 MG/2ML IJ SOLN
INTRAMUSCULAR | Status: DC | PRN
  Administered 2012-04-07: 4 mg via INTRAVENOUS

## 2012-04-07 MED ORDER — CEFAZOLIN SODIUM-DEXTROSE 2-3 GM-% IV SOLR
2.0000 g | INTRAVENOUS | Status: AC
  Administered 2012-04-07: 2 g via INTRAVENOUS

## 2012-04-07 MED ORDER — ACETAMINOPHEN 650 MG RE SUPP
650.0000 mg | Freq: Four times a day (QID) | RECTAL | Status: DC | PRN
  Filled 2012-04-07: qty 1

## 2012-04-07 MED ORDER — ACETAMINOPHEN 325 MG PO TABS: 650.0000 mg | ORAL_TABLET | Freq: Four times a day (QID) | ORAL | Status: DC | PRN

## 2012-04-07 MED ORDER — LACTATED RINGERS IV SOLN
INTRAVENOUS | Status: DC
  Administered 2012-04-07: 15:00:00 via INTRAVENOUS

## 2012-04-07 MED ORDER — OXYCODONE-ACETAMINOPHEN 5-325 MG PO TABS
1.0000 | ORAL_TABLET | ORAL | Status: DC | PRN
  Administered 2012-04-07 – 2012-04-08 (×3): 1 via ORAL
  Filled 2012-04-07: qty 1
  Filled 2012-04-07: qty 2
  Filled 2012-04-07: qty 1

## 2012-04-07 MED ORDER — HYDROCODONE-ACETAMINOPHEN 5-325 MG PO TABS: 1.0000 | ORAL_TABLET | ORAL | Status: DC | PRN

## 2012-04-07 MED ORDER — PROMETHAZINE HCL 25 MG/ML IJ SOLN: 6.2500 mg | INTRAMUSCULAR | Status: DC | PRN

## 2012-04-07 MED ORDER — ONDANSETRON HCL 4 MG PO TABS
4.0000 mg | ORAL_TABLET | Freq: Four times a day (QID) | ORAL | Status: DC | PRN
  Filled 2012-04-07: qty 1

## 2012-04-07 MED ORDER — SUCCINYLCHOLINE CHLORIDE 20 MG/ML IJ SOLN
INTRAMUSCULAR | Status: DC | PRN
  Administered 2012-04-07: 100 mg via INTRAVENOUS

## 2012-04-07 MED ORDER — LISINOPRIL 10 MG PO TABS
10.0000 mg | ORAL_TABLET | Freq: Every day | ORAL | Status: DC
  Administered 2012-04-08: 10 mg via ORAL
  Filled 2012-04-07 (×2): qty 1

## 2012-04-07 MED ORDER — ACETAMINOPHEN 10 MG/ML IV SOLN
INTRAVENOUS | Status: DC | PRN
  Administered 2012-04-07: 1000 mg via INTRAVENOUS

## 2012-04-07 MED ORDER — CEFAZOLIN SODIUM-DEXTROSE 2-3 GM-% IV SOLR
INTRAVENOUS | Status: AC
  Filled 2012-04-07: qty 50

## 2012-04-07 MED ORDER — METOCLOPRAMIDE HCL 5 MG/ML IJ SOLN: 5.0000 mg | Freq: Three times a day (TID) | INTRAMUSCULAR | Status: DC | PRN

## 2012-04-07 MED ORDER — HYDROMORPHONE HCL PF 1 MG/ML IJ SOLN
0.5000 mg | INTRAMUSCULAR | Status: DC | PRN
Start: 2012-04-07 — End: 2012-04-08

## 2012-04-07 MED ORDER — SODIUM CHLORIDE 0.9 % IR SOLN
Status: DC | PRN
  Administered 2012-04-07: 13:00:00

## 2012-04-07 SURGICAL SUPPLY — 46 items
BAG SPEC THK2 15X12 ZIP CLS (MISCELLANEOUS) ×1
BAG ZIPLOCK 12X15 (MISCELLANEOUS) ×2 IMPLANT
BLADE OSCILLATING/SAGITTAL (BLADE) ×2
BLADE SW THK.38XMED LNG THN (BLADE) ×1 IMPLANT
BNDG COHESIVE 6X5 TAN NS LF (GAUZE/BANDAGES/DRESSINGS) IMPLANT
BUR OVAL CARBIDE 4.0 (BURR) ×2 IMPLANT
CLEANER TIP ELECTROSURG 2X2 (MISCELLANEOUS) ×2 IMPLANT
CLOTH BEACON ORANGE TIMEOUT ST (SAFETY) ×2 IMPLANT
DRAPE POUCH INSTRU U-SHP 10X18 (DRAPES) ×2 IMPLANT
DRSG EMULSION OIL 3X3 NADH (GAUZE/BANDAGES/DRESSINGS) ×2 IMPLANT
DRSG PAD ABDOMINAL 8X10 ST (GAUZE/BANDAGES/DRESSINGS) ×4 IMPLANT
DURAPREP 26ML APPLICATOR (WOUND CARE) ×2 IMPLANT
ELECT REM PT RETURN 9FT ADLT (ELECTROSURGICAL) ×2
ELECTRODE REM PT RTRN 9FT ADLT (ELECTROSURGICAL) ×1 IMPLANT
GLOVE BIOGEL PI IND STRL 8 (GLOVE) ×1 IMPLANT
GLOVE BIOGEL PI INDICATOR 8 (GLOVE) ×1
GLOVE ECLIPSE 8.0 STRL XLNG CF (GLOVE) ×4 IMPLANT
GLOVE SURG SS PI 6.5 STRL IVOR (GLOVE) ×4 IMPLANT
GOWN PREVENTION PLUS LG XLONG (DISPOSABLE) ×4 IMPLANT
KIT BASIN OR (CUSTOM PROCEDURE TRAY) ×2 IMPLANT
MANIFOLD NEPTUNE II (INSTRUMENTS) ×2 IMPLANT
NDL MA TROC 1/2 (NEEDLE) IMPLANT
NEEDLE MA TROC 1/2 (NEEDLE) IMPLANT
NS IRRIG 1000ML POUR BTL (IV SOLUTION) IMPLANT
PACK SHOULDER CUSTOM OPM052 (CUSTOM PROCEDURE TRAY) ×2 IMPLANT
PAD ABD 7.5X8 STRL (GAUZE/BANDAGES/DRESSINGS) ×1 IMPLANT
PASSER SUT SWANSON 36MM LOOP (INSTRUMENTS) IMPLANT
PATCH TISSUE MEND 3X3CM (Orthopedic Implant) ×1 IMPLANT
POSITIONER SURGICAL ARM (MISCELLANEOUS) ×2 IMPLANT
SLING ARM IMMOBILIZER LRG (SOFTGOODS) ×2 IMPLANT
SPONGE GAUZE 4X4 12PLY (GAUZE/BANDAGES/DRESSINGS) ×1 IMPLANT
SPONGE SURGIFOAM ABS GEL 100 (HEMOSTASIS) IMPLANT
STAPLER VISISTAT 35W (STAPLE) ×2 IMPLANT
STRIP CLOSURE SKIN 1/2X4 (GAUZE/BANDAGES/DRESSINGS) ×2 IMPLANT
STRIP CLOSURE SKIN 1/4X4 (GAUZE/BANDAGES/DRESSINGS) ×1 IMPLANT
SUCTION FRAZIER 12FR DISP (SUCTIONS) ×2 IMPLANT
SUT BONE WAX W31G (SUTURE) ×2 IMPLANT
SUT ETHIBOND NAB CT1 #1 30IN (SUTURE) ×3 IMPLANT
SUT MNCRL AB 4-0 PS2 18 (SUTURE) ×2 IMPLANT
SUT VIC AB 0 CT1 27 (SUTURE) ×2
SUT VIC AB 0 CT1 27XBRD ANTBC (SUTURE) ×1 IMPLANT
SUT VIC AB 1 CT1 27 (SUTURE) ×4
SUT VIC AB 1 CT1 27XBRD ANTBC (SUTURE) ×2 IMPLANT
SUT VIC AB 2-0 CT1 27 (SUTURE) ×6
SUT VIC AB 2-0 CT1 27XBRD (SUTURE) IMPLANT
TOWEL OR 17X26 10 PK STRL BLUE (TOWEL DISPOSABLE) ×4 IMPLANT

## 2012-04-07 NOTE — Interval H&P Note (Signed)
History and Physical Interval Note:  04/07/2012 12:54 PM  Tammy Page  has presented today for surgery, with the diagnosis of right shoulder rotator cuff tear  The various methods of treatment have been discussed with the patient and family. After consideration of risks, benefits and other options for treatment, the patient has consented to  Procedure(s) with comments: Right Shoulder Open Rotator Cuff Repair with Anchors and Graft,  Excision of Sub-Deltoid Lipoma  (Right) - Right Shoulder Open Rotator Cuff Repair with Anchors and Graft,  Excision of Sub-Deltoid Lipoma  as a surgical intervention .  The patient's history has been reviewed, patient examined, no change in status, stable for surgery.  I have reviewed the patient's chart and labs.  Questions were answered to the patient's satisfaction.     Arissa Fagin A

## 2012-04-07 NOTE — Transfer of Care (Signed)
Immediate Anesthesia Transfer of Care Note  Patient: Tammy Page  Procedure(s) Performed: Procedure(s) with comments: Right Shoulder Open Rotator Cuff Repair with Anchors and Graft,  Excision of Sub-Deltoid Lipoma  (Right) - Right Shoulder Open Rotator Cuff Repair with Anchors and Graft,  Excision of Sub-Deltoid Lipoma   Patient Location: PACU  Anesthesia Type:General  Level of Consciousness: awake, alert , oriented and patient cooperative  Airway & Oxygen Therapy: Patient Spontanous Breathing and Patient connected to face mask oxygen  Post-op Assessment: Report given to PACU RN, Post -op Vital signs reviewed and stable and Patient moving all extremities X 4  Post vital signs: stable  Complications: No apparent anesthesia complications

## 2012-04-07 NOTE — H&P (View-Only) (Signed)
PCR results faxed via EPIC to Dr Gioffre.   

## 2012-04-07 NOTE — Anesthesia Preprocedure Evaluation (Signed)
Anesthesia Evaluation  Patient identified by MRN, date of birth, ID band Patient awake    Reviewed: Allergy & Precautions, H&P , NPO status , Patient's Chart, lab work & pertinent test results  Airway Mallampati: III TM Distance: >3 FB Neck ROM: Full    Dental  (+) Edentulous Upper, Partial Lower and Dental Advisory Given   Pulmonary neg pulmonary ROS,  breath sounds clear to auscultation  Pulmonary exam normal       Cardiovascular hypertension, Pt. on home beta blockers + dysrhythmias Atrial Fibrillation Rhythm:Regular Rate:Normal     Neuro/Psych negative neurological ROS  negative psych ROS   GI/Hepatic Neg liver ROS, GERD-  Medicated,  Endo/Other  Hyperthyroidism   Renal/GU negative Renal ROS  negative genitourinary   Musculoskeletal negative musculoskeletal ROS (+)   Abdominal   Peds  Hematology negative hematology ROS (+)   Anesthesia Other Findings   Reproductive/Obstetrics negative OB ROS                           Anesthesia Physical Anesthesia Plan  ASA: II  Anesthesia Plan: General   Post-op Pain Management:    Induction: Intravenous  Airway Management Planned: Oral ETT  Additional Equipment:   Intra-op Plan:   Post-operative Plan: Extubation in OR  Informed Consent: I have reviewed the patients History and Physical, chart, labs and discussed the procedure including the risks, benefits and alternatives for the proposed anesthesia with the patient or authorized representative who has indicated his/her understanding and acceptance.   Dental advisory given  Plan Discussed with: CRNA  Anesthesia Plan Comments:         Anesthesia Quick Evaluation

## 2012-04-07 NOTE — Brief Op Note (Signed)
04/07/2012  2:19 PM  PATIENT:  Tammy Page  79 y.o. female  PRE-OPERATIVE DIAGNOSIS:  right shoulder rotator cuff tear, COMPLETE and COMPLEX  POST-OPERATIVE DIAGNOSIS:  right shoulder rotator cuff tear,COMPLETE and COMPLEX.  PROCEDURE:  Procedure(s) with comments: Right Shoulder Open Rotator Cuff Repair with Anchors and Graft,  Excision of Sub-Deltoid Lipoma  (Right) - Right Shoulder Open Rotator Cuff Repair with Anchors and Graft,  Excision of Sub-Deltoid Lipoma   SURGEON:  Surgeon(s) and Role:    * Jacki Cones, MD - Primary  PHYSICIAN ASSISTANT: Dimitri Ped PA  ASSISTANTS:Amber Ferndale PA  ANESTHESIA:   general  EBL:  Total I/O In: 1000 [I.V.:1000] Out: -   BLOOD ADMINISTERED:none  DRAINS: none   LOCAL MEDICATIONS USED:  BUPIVICAINE -I used 10cc of a mixture of 20cc Bupivicaine and 10cc Normal Saline.  SPECIMEN:  No Specimen  DISPOSITION OF SPECIMEN:  N/A  COUNTS:  YES  TOURNIQUET:  * No tourniquets in log *  DICTATION: .Other Dictation: Dictation Number [id number]  PLAN OF CARE: Admit for overnight observation  PATIENT DISPOSITION:  Stable in OR.   Delay start of Pharmacological VTE agent (>24hrs) due to surgical blood loss or risk of bleeding: yes

## 2012-04-07 NOTE — Anesthesia Postprocedure Evaluation (Signed)
Anesthesia Post Note  Patient: Tammy Page  Procedure(s) Performed: Procedure(s) (LRB): Right Shoulder Open Rotator Cuff Repair with Anchors and Graft,  Excision of Sub-Deltoid Lipoma  (Right)  Anesthesia type: General  Patient location: PACU  Post pain: Pain level controlled  Post assessment: Post-op Vital signs reviewed  Last Vitals:  Filed Vitals:   04/07/12 1530  BP: 156/81  Pulse: 61  Temp:   Resp: 12    Post vital signs: Reviewed  Level of consciousness: sedated  Complications: No apparent anesthesia complications

## 2012-04-08 ENCOUNTER — Encounter (HOSPITAL_COMMUNITY): Payer: Self-pay | Admitting: Orthopedic Surgery

## 2012-04-08 MED ORDER — METHOCARBAMOL 500 MG PO TABS: 500.0000 mg | ORAL_TABLET | Freq: Four times a day (QID) | ORAL | Status: DC

## 2012-04-08 MED ORDER — OXYCODONE-ACETAMINOPHEN 10-325 MG PO TABS: 1.0000 | ORAL_TABLET | ORAL | Status: DC | PRN

## 2012-04-08 NOTE — Op Note (Signed)
[name], Tammy Page NO.:  [id number]  MEDICAL RECORD NO.:  [id number]  LOCATION:  1606                         FACILITY:  [facility]  PHYSICIAN:  Georges Lynch. Brileigh Sevcik, M.D.DATE OF BIRTH:  Oct 22, 1932  DATE OF PROCEDURE:  04/07/2012 DATE OF DISCHARGE:                              OPERATIVE REPORT   PREOPERATIVE DIAGNOSES: 1. Complex tear of the rotator cuff tendon, right shoulder. 2. Severe impingement syndrome, right shoulder. 3. Small subdeltoid lipoma, right shoulder.  POSTOPERATIVE DIAGNOSES: 1. Complex tear of the rotator cuff tendon, right shoulder. 2. Severe impingement syndrome, right shoulder. 3. Small subdeltoid lipoma, right shoulder.  OPERATIONS: 1. Open acromionectomy and acromioplasty, right shoulder. 2. Repair of a large tear of the rotator cuff tendon, right shoulder. 3. Tissue mend graft, right shoulder to the rotator cuff. 4. Excision of a very small portion of a lipoma which is subdeltoid,     right shoulder.  SURGEON:  Georges Lynch. Darrelyn Hillock, M.D.  ASSISTANT:  Dimitri Ped, [place]  DESCRIPTION OF PROCEDURE:  Under general anesthesia with the patient in the beach chair position, a routine orthopedic prep and drape of the right shoulder was carried out.  The patient had 2 g of IV Ancef.  At this time, the appropriate time-out was carried out prior to surgery.  I also marked the appropriate right arm in the holding area.  At this point, incision was made over the anterior aspect of the right shoulder. Bleeders identified and cauterized.  I then identified the acromion.  I split the deltoid tendon by sharp dissection and exposed the acromion. Note, she had severe impingement.  There was literally no room to highlight that between the rotator cuff and the acromion.  In fact, then I went down and excised the subdeltoid bursa.  Large amount of fluid came out of the joint.  At this point, I protected the underlying rotator cuff, which was severely torn  and I protected it with a Bennett retractor and I utilized the oscillating saw and the burr to do a partial acromionectomy and acromioplasty.  At this time, I thoroughly irrigated the area, bone waxed the undersurface of the acromion.  I then removed the subdeltoid bursa and a small lipoma that was present.  This was extremely small.  I thoroughly irrigated the area.  I identified the rotator cuff first with a primary repair and then reinforced the tear with a TissueMend graft.  Metal anchor was necessary.  Once this was done, I went back and reinspected the area and made sure we had re- established the subacromial space which we did.  We irrigated the wound and closed the wound layers in usual fashion after I first reapproximated the deltoid tendon muscle in usual fashion.  I injected 10 mL of a mixture of 20 mL of Exparel and 10 mL of normal saline.  This was injected in the soft tissue.  I did aspirate to make sure we were not at the vessel.  The wound then was closed in usual fashion as I mentioned with a Monocryl subcuticular suture.  Sterile dressings were applied.  She was placed in a shoulder immobilizer.  ______________________________ Georges Lynch Darrelyn Hillock, M.D.     RAG/MEDQ  D:  04/07/2012  T:  04/08/2012  Job:  [id number]

## 2012-04-08 NOTE — Evaluation (Signed)
Occupational Therapy Evaluation Patient Details Name: Tammy Page MRN: [id number] DOB: 05/12/1932 Today's Date: 04/08/2012 Time: 4098-1191 OT Time Calculation (min): 43 min  OT Assessment / Plan / Recommendation Clinical Impression  This 79 year old female was admitted for R RCR.  All education was completed with pt and she will have initial 24/7 then intermittent assist at home.  Will see pt one more time when niece is here to have her practice sling and review adls.      OT Assessment  Patient needs continued OT Services    Follow Up Recommendations   (pt will follow up with dr Darrelyn Hillock)    Barriers to Discharge      Equipment Recommendations  None recommended by OT    Recommendations for Other Services    Frequency  Min 2X/week    Precautions / Restrictions Precautions Precautions: Shoulder Type of Shoulder Precautions: sling aat x bathing dressing Precaution Booklet Issued: Yes (comment) Restrictions Weight Bearing Restrictions: No   Pertinent Vitals/Pain r shoulder sore:  Premedicated and repositioned.  Pt wanted ice off   ADL  Upper Body Bathing: Performed;Minimal assistance Where Assessed - Upper Body Bathing: Unsupported sitting Lower Body Bathing: Performed;Set up Where Assessed - Lower Body Bathing: Unsupported sit to stand Upper Body Dressing: Performed;Moderate assistance (including sling) Where Assessed - Upper Body Dressing: Unsupported sitting Lower Body Dressing: Performed;Set up Where Assessed - Lower Body Dressing: Unsupported sit to stand Toilet Transfer: Simulated;Supervision/safety (bed to recliner) Toilet Transfer Method: Sit to stand Toileting - Clothing Manipulation and Hygiene: Simulated;Modified independent Where Assessed - Toileting Clothing Manipulation and Hygiene: Sit to stand from 3-in-1 or toilet Transfers/Ambulation Related to ADLs: pt able to get up from hospital bed with rail, supervision.  Educated to have family stand on L side  during mobility.  Pt may sleep in a chair/ottoman she has ADL Comments: reviewed shoulder protcol and pt verbalizes understanding.  Will return to have niece demonstrate sling application and briefly review ub adls    OT Diagnosis: Generalized weakness  OT Problem List: Impaired UE functional use;Pain (decreased family education) OT Treatment Interventions: Self-care/ADL training;Patient/family education   OT Goals Acute Rehab OT Goals OT Goal Formulation: With patient Time For Goal Achievement: 04/09/12 Potential to Achieve Goals: Good Miscellaneous OT Goals Miscellaneous OT Goal #1: family will be independent with sling application and verbalize understanding of ADL adaptations to avoid movement of R shoulder OT Goal: Miscellaneous Goal #1 - Progress: Goal set today  Visit Information  Last OT Received On: 04/08/12 Assistance Needed: +1    Subjective Data  Subjective: My niece and daughter will stay with me a little while.  They can come in and out to help Patient Stated Goal: home, independent as possible.  Get back to walking for exercise   Prior Functioning     Home Living Lives With: Alone Available Help at Discharge: Family Type of Home: House Bathroom Shower/Tub: Tub/shower unit (plans to sponge bathe: has bar, can't use 1 handed) Firefighter: Standard Prior Function Level of Independence: Independent Communication Communication: No difficulties Dominant Hand: Right         Vision/Perception     Cognition  Cognition Overall Cognitive Status: Appears within functional limits for tasks assessed/performed Arousal/Alertness: Awake/alert Orientation Level: Appears intact for tasks assessed Behavior During Session: [facility] for tasks performed    Extremity/Trunk Assessment Right Upper Extremity Assessment RUE ROM/Strength/Tone: Deficits;Due to precautions Left Upper Extremity Assessment LUE ROM/Strength/Tone: Within functional levels     Mobility  Exercise Donning/doffing shirt without moving shoulder: Patient able to independently direct caregiver Method for sponge bathing under operated UE: Patient able to independently direct caregiver Donning/doffing sling/immobilizer:  (needs reinforcement) Correct positioning of sling/immobilizer: Patient able to independently direct caregiver ROM for elbow, wrist and digits of operated UE: Independent Sling wearing schedule (on at all times/off for ADL's): Independent Proper positioning of operated UE when showering: Independent Positioning of UE while sleeping: Patient able to independently direct caregiver   Balance     End of Session OT - End of Session Activity Tolerance: Patient tolerated treatment well Patient left: in chair;with call bell/phone within reach  GO Functional Assessment Tool Used: clinical observation Functional Limitation: Self care Self Care Current Status (Z6109): At least 20 percent but less than 40 percent impaired, limited or restricted Self Care Goal Status (U0454): At least 20 percent but less than 40 percent impaired, limited or restricted Self Care Discharge Status [phone]): At least 20 percent but less than 40 percent impaired, limited or restricted   [facility] 04/08/2012, 11:15 AM Marica Otter, OTR/L [phone] 04/08/2012

## 2012-04-08 NOTE — Progress Notes (Signed)
Subjective: 1 Day Post-Op Procedure(s) (LRB): Right Shoulder Open Rotator Cuff Repair with Anchors and Graft,  Excision of Sub-Deltoid Lipoma  (Right) Patient reports pain as 2 on 0-10 scale.  Dressing changed and wound looks fine.  Objective: Vital signs in last 24 hours: Temp:  [97.3 F (36.3 C)-98 F (36.7 C)] 98 F (36.7 C) (02/20 0550) Pulse Rate:  [55-78] 63 (02/20 0550) Resp:  [12-20] 16 (02/20 0550) BP: (90-159)/(60-93) 144/76 mmHg (02/20 0550) SpO2:  [100 %] 100 % (02/20 0550) Weight:  [78.472 kg (173 lb)] 78.472 kg (173 lb) (02/19 1551)  Intake/Output from previous day: 02/19 0701 - 02/20 0700 In: 3400.7 [P.O.:800; I.V.:2045.7; IV Piggyback:555] Out: 1425 [Urine:1425] Intake/Output this shift: Total I/O In: 240 [P.O.:240] Out: -    Recent Labs  04/06/12 1055  HGB 14.1    Recent Labs  04/06/12 1055  WBC 5.1  RBC 4.63  HCT 42.4  PLT 204    Recent Labs  04/06/12 1055  NA 138  K 4.4  CL 100  CO2 30  BUN 14  CREATININE 1.04  GLUCOSE 88  CALCIUM 9.7    Recent Labs  04/06/12 1055  INR 1.00    Neurologically intact No cellulitis present  Assessment/Plan: 1 Day Post-Op Procedure(s) (LRB): Right Shoulder Open Rotator Cuff Repair with Anchors and Graft,  Excision of Sub-Deltoid Lipoma  (Right) Up with therapy Discharge home with home health discontinued Today. Will not need Home Health/  Samule Life A 04/08/2012, 10:00 AM

## 2012-04-08 NOTE — Progress Notes (Signed)
Occupational Therapy Treatment Patient Details Name: Tammy Page MRN: [id number] DOB: November 11, 1932 Today's Date: 04/08/2012 Time: 1191-4782 OT Time Calculation (min): 21 min  OT Assessment / Plan / Recommendation Comments on Treatment Session      Follow Up Recommendations   (pt will follow up with dr Darrelyn Hillock)    Barriers to Discharge       Equipment Recommendations  None recommended by OT    Recommendations for Other Services    Frequency Min 2X/week   Plan      Precautions / Restrictions Precautions Precautions: Shoulder Type of Shoulder Precautions: sling aat x bathing dressing Precaution Booklet Issued: Yes (comment) Restrictions Weight Bearing Restrictions: No   Pertinent Vitals/Pain RUE sore    ADL    ADL Comments: Niece donned sling and verbalizes understanding of bathing, dressing and applying powder under RUE.  Therapist demonstrated dressing.  Pt will stay with daughter now.      OT Diagnosis: Generalized weakness  OT Problem List: Impaired UE functional use;Pain (decreased family education) OT Treatment Interventions: Self-care/ADL training;Patient/family education   OT Goals Acute Rehab OT Goals OT Goal Formulation: With patient Time For Goal Achievement: 04/09/12 Potential to Achieve Goals: Good Miscellaneous OT Goals Miscellaneous OT Goal #1: family will be independent with sling application and verbalize understanding of ADL adaptations to avoid movement of R shoulder OT Goal: Miscellaneous Goal #1 - Progress: Met  Visit Information  Last OT Received On: 04/08/12 Assistance Needed: +1    Subjective Data     Prior Functioning      Cognition  Cognition Overall Cognitive Status: Appears within functional limits for tasks assessed/performed Arousal/Alertness: Awake/alert Orientation Level: Appears intact for tasks assessed Behavior During Session: [facility] for tasks performed    Mobility       Exercises  Donning/doffing shirt without  moving shoulder: Patient able to independently direct caregiver Method for sponge bathing under operated UE: Patient able to independently direct caregiver Donning/doffing sling/immobilizer: Caregiver independent with task Correct positioning of sling/immobilizer: Patient able to independently direct caregiver ROM for elbow, wrist and digits of operated UE: Independent Sling wearing schedule (on at all times/off for ADL's): Independent Proper positioning of operated UE when showering: Independent Dressing change: Patient able to independently direct caregiver Positioning of UE while sleeping: Patient able to independently direct caregiver   Balance     End of Session OT - End of Session Activity Tolerance: Patient tolerated treatment well Patient left: in chair;with call bell/phone within reach  GO Functional Assessment Tool Used: clinical observation Functional Limitation: Self care Self Care Current Status (N5621): At least 20 percent but less than 40 percent impaired, limited or restricted Self Care Goal Status (H0865): At least 20 percent but less than 40 percent impaired, limited or restricted Self Care Discharge Status [phone]): At least 20 percent but less than 40 percent impaired, limited or restricted   [facility] 04/08/2012, 12:52 PM Marica Otter, OTR/L [phone] 04/08/2012

## 2012-04-13 NOTE — Discharge Summary (Signed)
Physician Discharge Summary  Patient ID: Tammy Page MRN: [id number] DOB/AGE: 77/05/1932 77 y.o.  Admit date: 04/07/2012 Discharge date: 04/13/2012  Admission Diagnoses:Torn Rotator cuff  Discharge Diagnoses: Torn Rotator Cuff Active Problems:   Complete tear of right rotator cuff   Discharged Condition: good  Hospital Course: No Complications  Consults: Occupational Therapy  Significant Diagnostic Studies: radiology: CXR: normal and No other studies needed.  Treatments: antibiotics: Ancef  Discharge Exam: Blood pressure 169/79, pulse 66, temperature 97.8 F (36.6 C), temperature source Oral, resp. rate 16, height 5\' 4"  (1.626 m), weight 78.472 kg (173 lb), SpO2 99.00%. Extremities: Wound site looked fine  Disposition: 01-Home or Self Care  Discharge Orders   Future Orders Complete By Expires     Call MD / Call 911  As directed     Comments:      If you experience chest pain or shortness of breath, CALL 911 and be transported to the hospital emergency room.  If you develope a fever above 101 F, pus (white drainage) or increased drainage or redness at the wound, or calf pain, call your surgeon's office.    Discharge instructions  As directed     Comments:      Keep your sling on at all times, including sleeping in your sling.  The only time you should remove your sling is to shower only but you need to keep your hand against your chest while you shower. Change dressing at time of Shower.  For the first few days, remove your dressing, tape a piece of saran wrap over your incision, take your shower, then remove the saran wrap and put a clean dressing on, then reapply your sling.  After two days you can shower without the saran wrap.   Call Dr. Darrelyn Hillock if any wound complications or temperature of 101 degrees F or over.   Call the office for an appointment to see Dr. Darrelyn Hillock in two weeks: [phone] and ask for Dr. Jeannetta Ellis nurse, [facility].    Driving  restrictions  As directed     Comments:      No driving for 4 weeks    Increase activity slowly as tolerated  As directed         Medication List    STOP taking these medications       oxyCODONE-acetaminophen 5-325 MG per tablet  Commonly known as:  PERCOCET/ROXICET      TAKE these medications       aspirin 81 MG tablet  Take 81 mg by mouth daily.     brimonidine 0.2 % ophthalmic solution  Commonly known as:  ALPHAGAN  Place 1 drop into both eyes 2 (two) times daily. Brinzolamide/brimonidine 1%/0.2% 1 drop each eye 2 x a day     Calcium Carb-Cholecalciferol 600-400 MG-UNIT Tabs  Take 1 tablet by mouth 2 (two) times daily.     diclofenac sodium 1 % Gel  Commonly known as:  VOLTAREN  Apply 1 application topically 4 (four) times daily as needed. Apply to painful areas on legs     levothyroxine 88 MCG tablet  Commonly known as:  SYNTHROID, LEVOTHROID  Take 88 mcg by mouth daily before breakfast.     lisinopril 10 MG tablet  Commonly known as:  PRINIVIL,ZESTRIL  Take 10 mg by mouth daily. Patient takes in am     methocarbamol 500 MG tablet  Commonly known as:  ROBAXIN  Take 1 tablet (500 mg total) by mouth 4 (four) times daily.  oxyCODONE-acetaminophen 10-325 MG per tablet  Commonly known as:  PERCOCET  Take 1 tablet by mouth every 4 (four) hours as needed for pain.     Travoprost (BAK Free) 0.004 % Soln ophthalmic solution  Commonly known as:  TRAVATAN  Place 1 drop into both eyes at bedtime.     vitamin C 1000 MG tablet  Take 1,000 mg by mouth daily.         Signed: Cedarius Kersh A 04/13/2012, 1:39 PM

## 2012-07-30 ENCOUNTER — Other Ambulatory Visit (HOSPITAL_COMMUNITY): Payer: Self-pay | Admitting: Orthopedic Surgery

## 2012-07-30 ENCOUNTER — Ambulatory Visit (HOSPITAL_COMMUNITY)
Admission: RE | Admit: 2012-07-30 | Discharge: 2012-07-30 | Disposition: A | Discharge status: Home / Self Care | Payer: Medicare Other | Source: Ambulatory Visit | Attending: Cardiovascular Disease | Admitting: Cardiovascular Disease

## 2012-07-30 DIAGNOSIS — M25561 Pain in right knee: Secondary | ICD-10-CM

## 2012-07-30 DIAGNOSIS — M7989 Other specified soft tissue disorders: Secondary | ICD-10-CM

## 2012-07-30 NOTE — Progress Notes (Signed)
Right Lower Extremity Duplex Completed. Negative. Erlene Quan

## 2012-10-13 IMAGING — US US SOFT TISSUE HEAD/NECK
1 series · 14 of 25 positions shown · non-contrast
Comparison: Ultrasound of the thyroid of 03/01/2010

CLINICAL DATA: Thyroid goiter, follow-up

THYROID ULTRASOUND
TECHNIQUE: Ultrasound examination of the thyroid gland and adjacent
soft tissues was performed.

[Series 1: us soft tissue head/neck · 0.08mm/px · 14 of 92 slices shown]
[im 1/92]
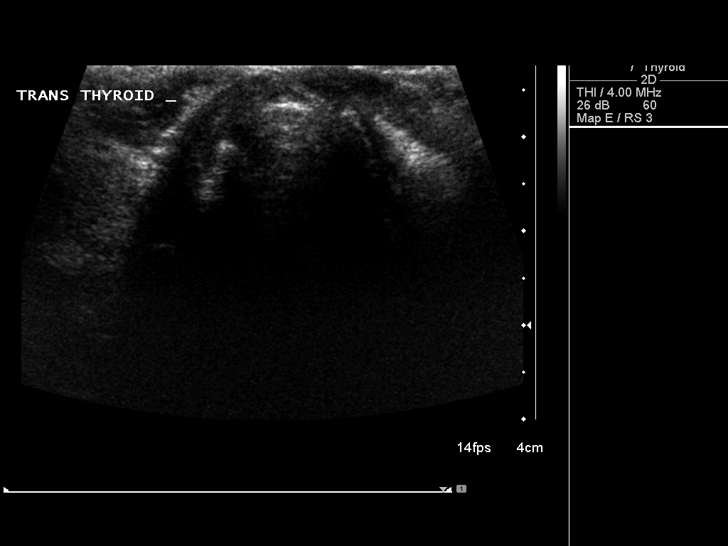
[im 8/92]
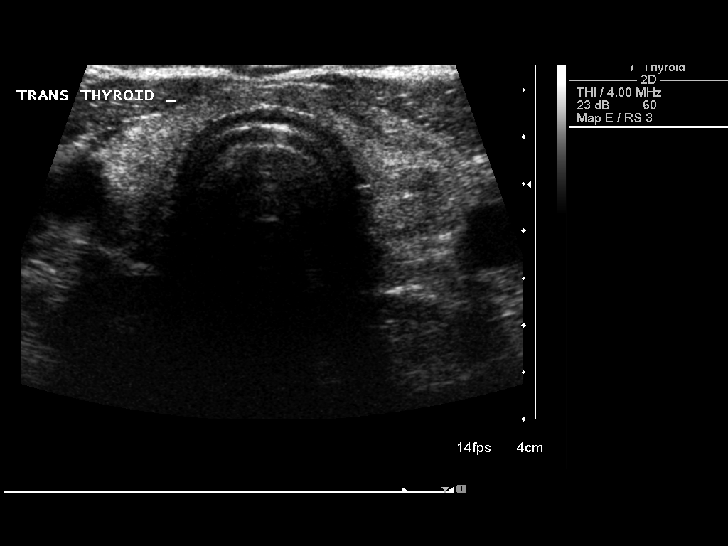
[im 16/92]
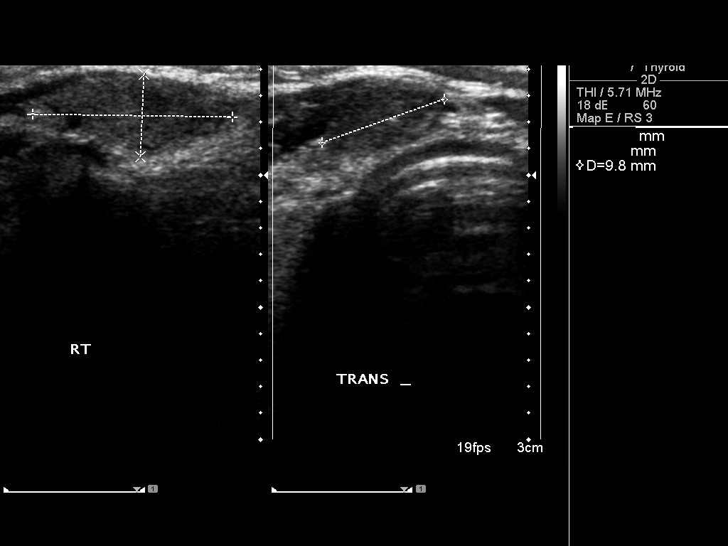
[im 23/92]
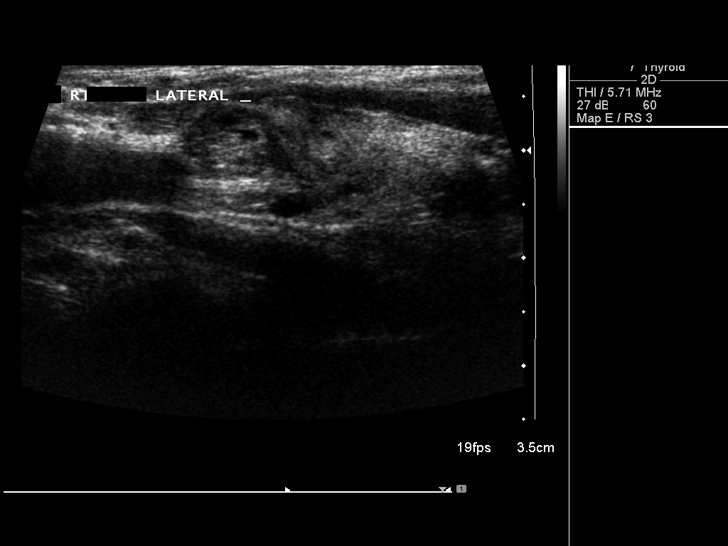
[im 31/92]
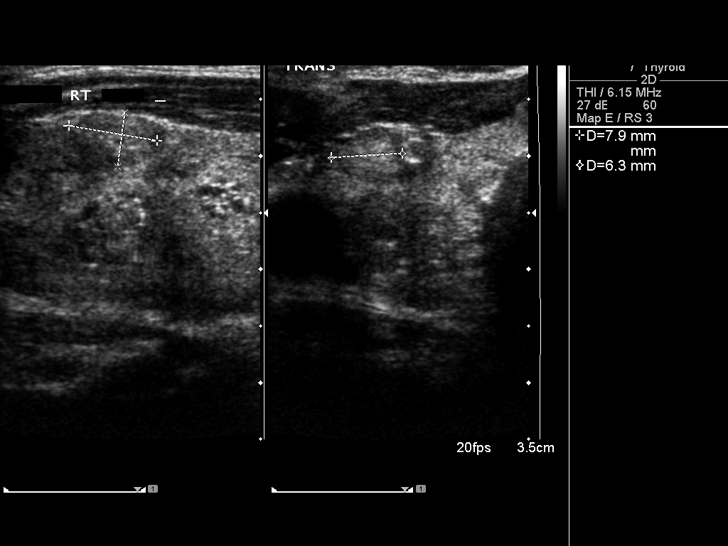
[im 35/92]
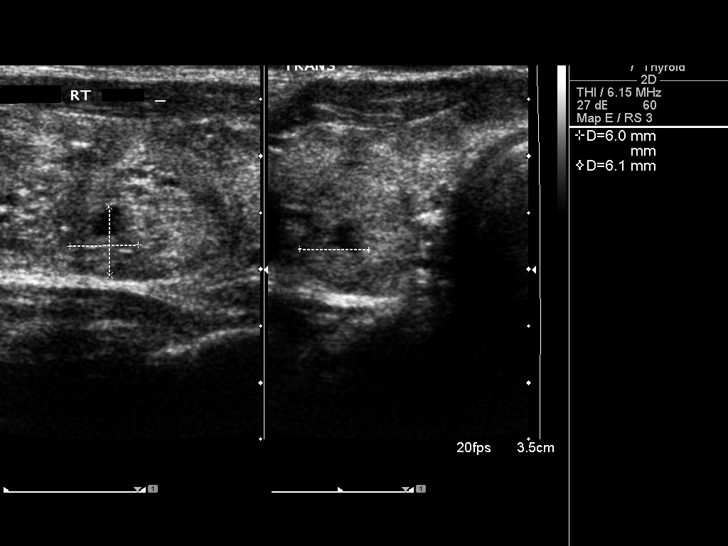
[im 42/92]
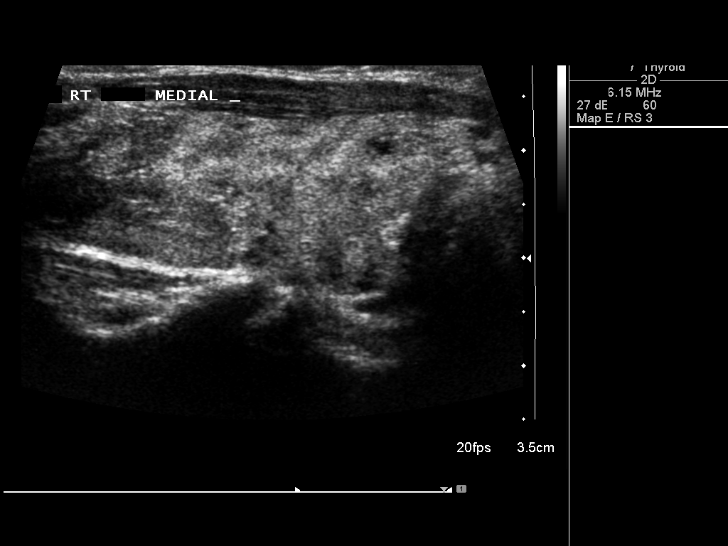
[im 50/92]
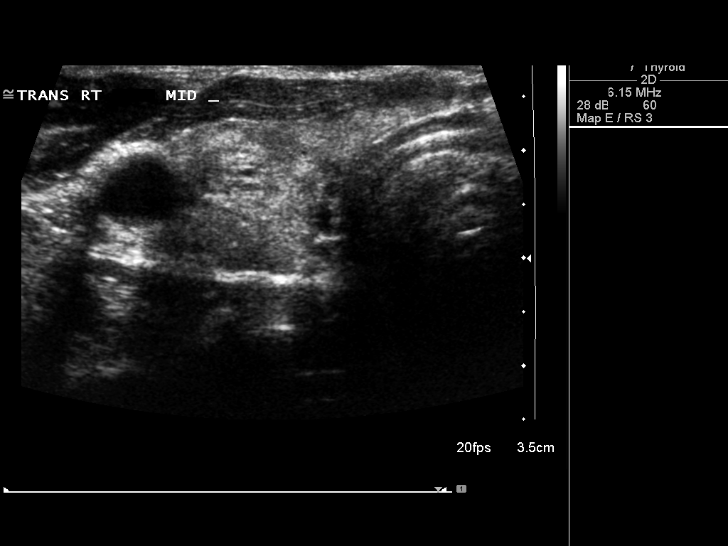
[im 57/92]
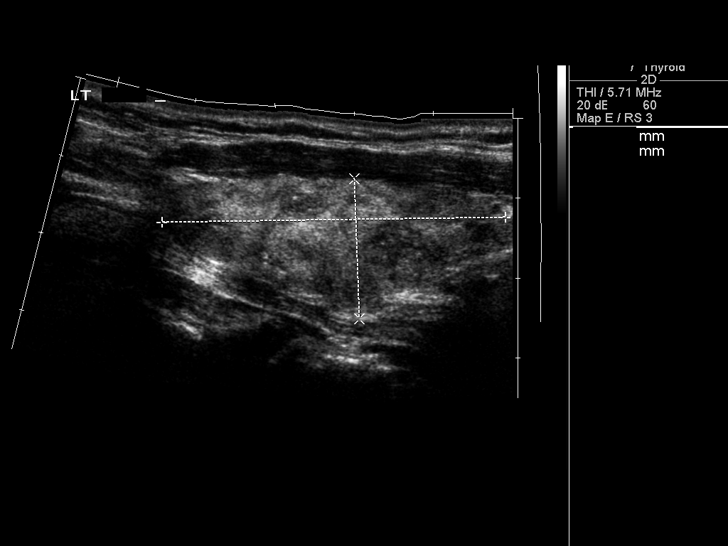
[im 61/92]
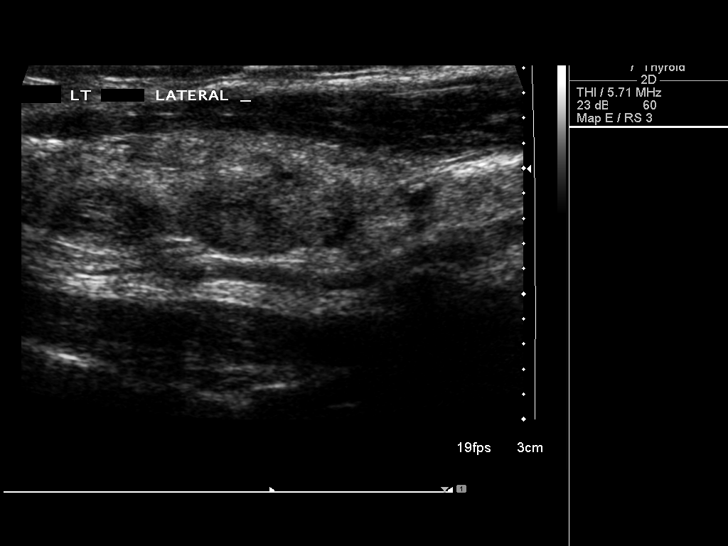
[im 69/92]
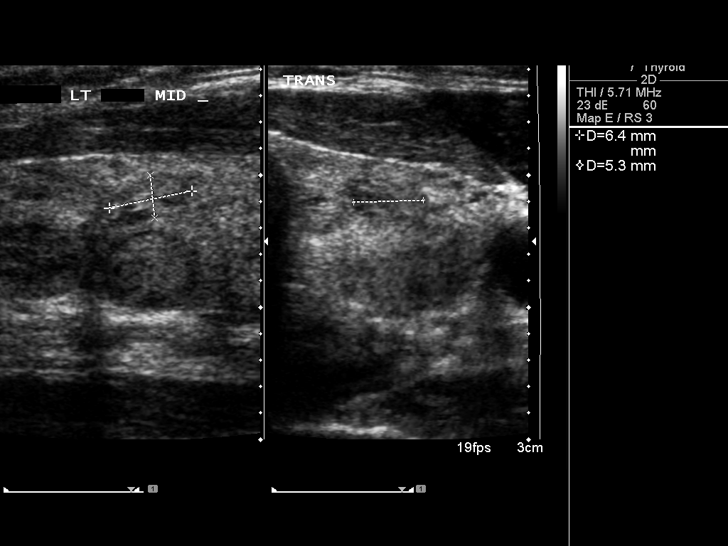
[im 76/92]
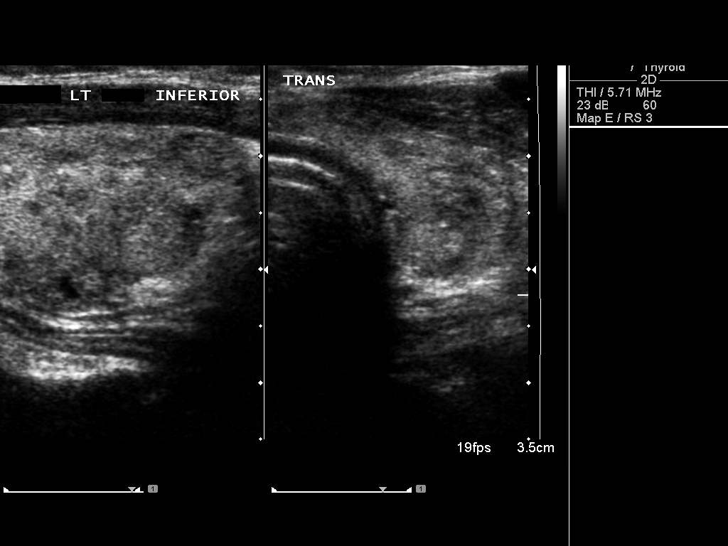
[im 84/92]
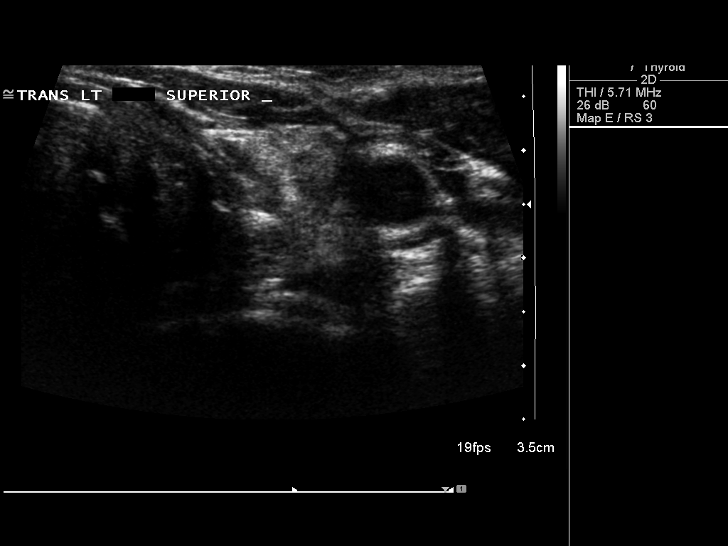
[im 92/92]
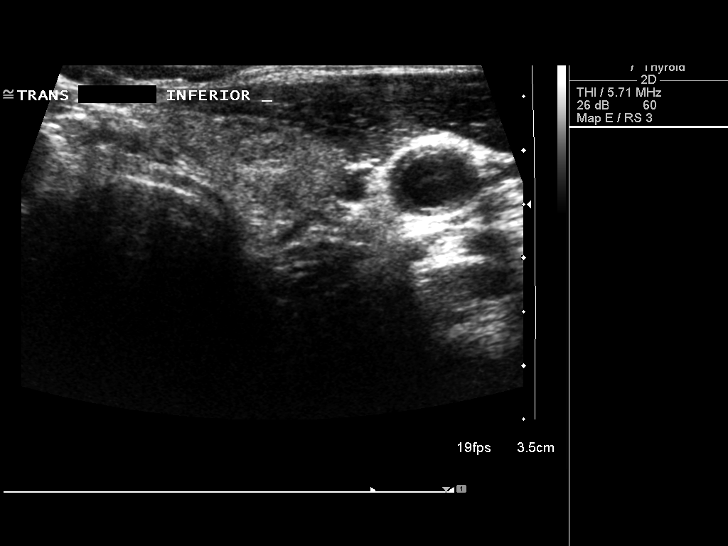

[14 of 25 positions shown; findings below may reference images not displayed]

FINDINGS: Right thyroid lobe:  4.5 x 1.5 x 1.7 cm.  (Previously 4.5 x 1.5 x
1.6 cm).
Left thyroid lobe:  4.3 x 1.8 x 1.7 cm.  (Previously 4.5 x 1.3 x
1.5 cm).
Isthmus:  3 mm in thickness.

Focal nodules:  The gland is very inhomogeneous in echogenicity.
Multiple thyroid nodules are noted bilaterally.  The largest nodule
is solid in the upper pole of the right lobe measuring 1.6 x 1.1 x
0.8 cm compared to 1.1 x 0.7 x 0.9 cm previously.  A solid nodule
in the lower pole on the right measures 1.1 x 1.1 x 1.0 cm compared
to 1.3 x 1.1 x 1.8 cm previously.  A right superior isthmic nodule
measures 1.5 x 0.6 x 1.0 cm compared to 1.1 x 0.4 x 0.9 cm
previously.  Nodules on the left measure no more than 9 mm in
maximum diameter.

Lymphadenopathy:  None visualized.
IMPRESSION: No significant change in multiple thyroid nodules.  No enlarging
nodule is seen.

## 2014-11-02 ENCOUNTER — Other Ambulatory Visit: Payer: Self-pay | Admitting: Gastroenterology

## 2014-12-14 ENCOUNTER — Other Ambulatory Visit: Payer: Self-pay | Admitting: Nurse Practitioner

## 2014-12-14 DIAGNOSIS — E2839 Other primary ovarian failure: Secondary | ICD-10-CM

## 2015-01-17 ENCOUNTER — Ambulatory Visit
Admission: RE | Admit: 2015-01-17 | Discharge: 2015-01-17 | Disposition: A | Discharge status: Home / Self Care | Payer: Medicare HMO | Source: Ambulatory Visit | Attending: Nurse Practitioner | Admitting: Nurse Practitioner

## 2015-01-17 DIAGNOSIS — E2839 Other primary ovarian failure: Secondary | ICD-10-CM

## 2015-12-11 ENCOUNTER — Encounter: Payer: Self-pay | Admitting: Gastroenterology

## 2016-02-06 ENCOUNTER — Ambulatory Visit (INDEPENDENT_AMBULATORY_CARE_PROVIDER_SITE_OTHER): Payer: Medicare HMO | Admitting: Gastroenterology

## 2016-02-06 ENCOUNTER — Encounter: Payer: Self-pay | Admitting: Gastroenterology

## 2016-02-06 VITALS — BP 122/78 | HR 78 | Ht 63.5 in | Wt 174.0 lb

## 2016-02-06 DIAGNOSIS — K5901 Slow transit constipation: Secondary | ICD-10-CM

## 2016-02-06 DIAGNOSIS — R140 Abdominal distension (gaseous): Secondary | ICD-10-CM | POA: Diagnosis not present

## 2016-02-06 NOTE — Progress Notes (Signed)
[facility] Consult Note:  History: Tammy Page 02/06/2016  Referring physician: Barrie Lyme, FNP  Reason for consult/chief complaint: Constipation (x 6-7 months with gas and bloating) and Gastroesophageal Reflux (uses vinegar and water)   Subjective  HPI:  This is an 82 year old woman referred by primary care for constipation and abdominal bloating. She reports 6-9 months of slowly increasing constipation, with passage of small pellet-like stools and feelings of incomplete evacuation. She often just does not feel the need for a BM every day. She does not feel like anything prolapses she does not have to put pressure on the perineal area to assist with defecation. She also denies rectal bleeding. Tammy Page reports intermittent abdominal bloating, especially if she does not have a BM every day. She also has "rumbling and gurgling". For many years she has had occasional reflux symptoms of regurgitation or pyrosis and belching. Tammy Page had a colonoscopy in 2004 with Dr. Amedeo Plenty at [facility]. A 1 cm tubular adenoma was removed from the right colon. She saw him in September 2016 for a repeat colonoscopy. A diminutive hyperplastic polyp was removed, and the exam was otherwise reportedly normal. She describes radiation therapy to the thyroid perhaps 5 years ago, and has been on Synthroid since then. She sees her endocrine doctor about once a year, and believes she is due to see them next month. I do not see any measurement of TSH or free T4 in the labs sent over to me by primary care. She has been gaining some weight over this last year, more than she would have expected. Tammy Page has been increasing her water intake and trying to stay active but is still having difficulty with constipation. ROS:  Review of Systems  Constitutional: Negative for appetite change and unexpected weight change.  HENT: Negative for mouth sores and voice change.   Eyes: Negative for pain and redness.   Respiratory: Negative for cough and shortness of breath.   Cardiovascular: Negative for chest pain and palpitations.  Genitourinary: Negative for dysuria and hematuria.  Musculoskeletal: Negative for arthralgias and myalgias.  Skin: Negative for pallor and rash.  Neurological: Negative for weakness and headaches.  Hematological: Negative for adenopathy.     Past Medical History: Past Medical History:  Diagnosis Date  . Allergic rhinitis   . Chest pain   . GERD (gastroesophageal reflux disease)   . Glaucoma   . Hyperlipemia   . Hypertension   . Hyperthyroidism   . Osteoarthritis   . Vitamin D deficiency      Past Surgical History: Past Surgical History:  Procedure Laterality Date  . ABDOMINAL HYSTERECTOMY     partial   . polyp removed from vagina     . SHOULDER OPEN ROTATOR CUFF REPAIR Right 04/07/2012   Procedure: Right Shoulder Open Rotator Cuff Repair with Anchors and Graft,  Excision of Sub-Deltoid Lipoma ;  Surgeon: Tobi Bastos, MD;  Location: [facility];  Service: Orthopedics;  Laterality: Right;  Right Shoulder Open Rotator Cuff Repair with Anchors and Graft,  Excision of Sub-Deltoid Lipoma   . TONSILLECTOMY     as a child      Family History: Family History  Problem Relation Age of Onset  . Hypertension Father   . Colon cancer Neg Hx   . Stomach cancer Neg Hx   . Esophageal cancer Neg Hx   . Rectal cancer Neg Hx   . Liver cancer Neg Hx     Social History: Social History   Social  History  . Marital status: Single    Spouse name: N/A  . Number of children: 2  . Years of education: N/A   Occupational History  . retired    Social History Main Topics  . Smoking status: Never Smoker  . Smokeless tobacco: Never Used  . Alcohol use No  . Drug use: No  . Sexual activity: Not Asked   Other Topics Concern  . None   Social History Narrative  . None   She is planning to get married next year. Allergies: No Known Allergies  Outpatient  Meds: Current Outpatient Prescriptions  Medication Sig Dispense Refill  . aspirin 81 MG tablet Take 81 mg by mouth daily.     . brimonidine (ALPHAGAN) 0.2 % ophthalmic solution Place 1 drop into both eyes 2 (two) times daily. Brinzolamide/brimonidine 1%/0.2% 1 drop each eye 2 x a day    . diclofenac sodium (VOLTAREN) 1 % GEL Apply 1 application topically 4 (four) times daily as needed. Apply to painful areas on legs    . levothyroxine (SYNTHROID, LEVOTHROID) 88 MCG tablet Take 88 mcg by mouth daily before breakfast.    . lisinopril-hydrochlorothiazide (PRINZIDE,ZESTORETIC) 10-12.5 MG tablet daily at 2 PM.    . Travoprost, BAK Free, (TRAVATAN) 0.004 % SOLN ophthalmic solution Place 1 drop into both eyes at bedtime.     No current facility-administered medications for this visit.       ___________________________________________________________________ Objective   Exam:  BP 122/78   Pulse 78   Ht 5' 3.5" (1.613 m)   Wt 174 lb (78.9 kg)   BMI 30.34 kg/m    General: this is a(n) Very healthy-appearing elderly woman   Eyes: sclera anicteric, no redness  ENT: oral mucosa moist without lesions, no cervical or supraclavicular lymphadenopathy, good dentition  CV: RRR without murmur, S1/S2, no JVD, no peripheral edema  Resp: clear to auscultation bilaterally, normal RR and effort noted  GI: soft, no tenderness, with active bowel sounds. No guarding or palpable organomegaly noted.  Skin; warm and dry, no rash or jaundice noted  Neuro: awake, alert and oriented x 3. Normal gross motor function and fluent speech  Labs: Recent calcium level normal, creatinine 1.0, BUN 16, hemoglobin A1c 6.2   Assessment: Encounter Diagnoses  Name Primary?  . Slow transit constipation Yes  . Abdominal bloating     She appears to have benign constipation related to decreased motility. I do not think she has any obstructive with an unremarkable colonoscopy 15 months ago. She denies abdominal  pain, rectal bleeding or weight loss.  We discussed,,and I wrote down, some simple therapy for constipation  I also highly recommended that she see her endocrinologist sometime next month to check TSH and free T4 and make sure that is not contributing to her constipation and weight gain. See me as needed  Thank you for the courtesy of this consult.  Please call me with any questions or concerns.  Nelida Meuse III  CC: Barrie Lyme, FNP

## 2016-02-06 NOTE — Patient Instructions (Addendum)
Constipation:  Increase your intake of water, fruits/vegetables and your activity level.  Ducosate stool softener, 100 mg twice daily. If not improved in 1 week after starting that, please add:  Miralax powder  1 capful in a glass of water or juice once daily.   If you are age 80 or older, your body mass index should be between 23-30. Your Body mass index is 30.34 kg/m. If this is out of the aforementioned range listed, please consider follow up with your Primary Care Provider.  If you are age 31 or younger, your body mass index should be between 19-25. Your Body mass index is 30.34 kg/m. If this is out of the aformentioned range listed, please consider follow up with your Primary Care Provider.   Thank you for choosing [facility]  Dr Wilfrid Lund III

## 2016-07-01 ENCOUNTER — Encounter: Payer: Self-pay | Admitting: Vascular Surgery

## 2016-07-02 ENCOUNTER — Other Ambulatory Visit: Payer: Self-pay

## 2016-07-02 DIAGNOSIS — I809 Phlebitis and thrombophlebitis of unspecified site: Secondary | ICD-10-CM

## 2016-07-08 ENCOUNTER — Encounter: Payer: Self-pay | Admitting: Vascular Surgery

## 2016-07-08 ENCOUNTER — Ambulatory Visit (INDEPENDENT_AMBULATORY_CARE_PROVIDER_SITE_OTHER): Payer: Medicare HMO | Admitting: Vascular Surgery

## 2016-07-08 ENCOUNTER — Ambulatory Visit (HOSPITAL_COMMUNITY)
Admission: RE | Admit: 2016-07-08 | Discharge: 2016-07-08 | Disposition: A | Discharge status: Home / Self Care | Payer: Medicare HMO | Source: Ambulatory Visit | Attending: Vascular Surgery | Admitting: Vascular Surgery

## 2016-07-08 VITALS — BP 121/82 | HR 73 | Temp 98.2°F | Resp 18 | Ht 63.5 in | Wt 172.8 lb

## 2016-07-08 DIAGNOSIS — I809 Phlebitis and thrombophlebitis of unspecified site: Secondary | ICD-10-CM | POA: Diagnosis not present

## 2016-07-08 DIAGNOSIS — I83891 Varicose veins of right lower extremities with other complications: Secondary | ICD-10-CM

## 2016-07-08 NOTE — Progress Notes (Signed)
Subjective:     Patient ID: Tammy Page, female   DOB: June 16, 1932, 83 y.o.   MRN: [id number]  HPI This 83 year old female was referred by Gwenlyn Perking for evaluation of recent thrombophlebitis in the right leg. About 4 weeks ago patient developed onset of discomfort lower third of the right leg medially. This was tender to touch. On May 1 she had an ultrasound performed which revealed localize thrombus and a perforating branch between the great saphenous vein and the posterior tibial vein. Patient has been on aspirin. She's had no further treatment and this has gradually improved and almost resolved since then. She has no history of DVT, previous thrombophlebitis, stasis ulcers, or bleeding. She does develop some swelling in both legs is a day progresses. She does not elastic compression stockings.  Past Medical History:  Diagnosis Date  . Allergic rhinitis   . Chest pain   . GERD (gastroesophageal reflux disease)   . Glaucoma   . Hyperlipemia   . Hypertension   . Hyperthyroidism   . Osteoarthritis   . Vitamin D deficiency     Social History  Substance Use Topics  . Smoking status: Never Smoker  . Smokeless tobacco: Never Used  . Alcohol use No    Family History  Problem Relation Age of Onset  . Hypertension Father   . Heart disease Brother   . Colon cancer Neg Hx   . Stomach cancer Neg Hx   . Esophageal cancer Neg Hx   . Rectal cancer Neg Hx   . Liver cancer Neg Hx     No Known Allergies   Current Outpatient Prescriptions:  .  aspirin 81 MG tablet, Take 81 mg by mouth daily. , Disp: , Rfl:  .  brimonidine (ALPHAGAN) 0.2 % ophthalmic solution, Place 1 drop into both eyes 2 (two) times daily. Brinzolamide/brimonidine 1%/0.2% 1 drop each eye 2 x a day, Disp: , Rfl:  .  diclofenac sodium (VOLTAREN) 1 % GEL, Apply 1 application topically 4 (four) times daily as needed. Apply to painful areas on legs, Disp: , Rfl:  .  levothyroxine (SYNTHROID, LEVOTHROID) 88 MCG tablet, Take  88 mcg by mouth daily before breakfast., Disp: , Rfl:  .  lisinopril-hydrochlorothiazide (PRINZIDE,ZESTORETIC) 10-12.5 MG tablet, Take 1 tablet by mouth daily at 2 PM. , Disp: , Rfl:  .  Travoprost, BAK Free, (TRAVATAN) 0.004 % SOLN ophthalmic solution, Place 1 drop into both eyes at bedtime., Disp: , Rfl:   Vitals:   07/08/16 1350  BP: 121/82  Pulse: 73  Resp: 18  Temp: 98.2 F (36.8 C)  TempSrc: Oral  SpO2: 98%  Weight: 172 lb 12.8 oz (78.4 kg)  Height: 5' 3.5" (1.613 m)    Body mass index is 30.13 kg/m.         Review of Systems Patient has GERD and hypertension. Denies dyspnea on exertion, PND, orthopnea, or claudication.    Objective:   Physical Exam BP 121/82 (BP Location: Left Arm, Patient Position: Sitting, Cuff Size: Normal)   Pulse 73   Temp 98.2 F (36.8 C) (Oral)   Resp 18   Ht 5' 3.5" (1.613 m)   Wt 172 lb 12.8 oz (78.4 kg)   SpO2 98%   BMI 30.13 kg/m     Gen.-alert and oriented x3 in no apparent distress HEENT normal for age Lungs no rhonchi or wheezing Cardiovascular regular rhythm no murmurs carotid pulses 3+ palpable no bruits audible Abdomen soft nontender no palpable masses Musculoskeletal free  of  major deformities Skin clear -no rashes Neurologic normal Lower extremities 3+ femoral and dorsalis pedis pulses palpable bilaterally with trace edema A few small bulging varicosities in the right medial calf. No hyperpigmentation or ulceration noted. Mild tenderness to deep palpation lower third right leg over great saphenous vein.  Today I ordered a venous duplex exam the right leg which I reviewed and interpreted in also performed days independent sono site ultrasound exam at the bedside. The patient does have evidence of chronic thrombus and a perforating branch of the lower third of the right leg between the great saphenous vein in the posterior tibial vein. The formal ultrasound room revealed no evidence of DVT and the above-mentioned  perforating branch is visualized and partially compressible. There is some reflux in the right saphenofemoral junction and great saphenous vein but the vein is not large caliber       Assessment:     #1 remote episode of thrombophlebitis involving perforating branch lower third right leg between great saphenous vein and posterior tibial vein but no DVT    Plan:     #1 continue daily aspirin #2 would not recommend any specific treatment for this as it has almost completely resolved #3 would not recommend treatment for her varicose veins in the right leg at this time since they are not "bothering her"

## 2016-07-24 ENCOUNTER — Other Ambulatory Visit: Payer: Self-pay | Admitting: Obstetrics and Gynecology

## 2016-07-24 DIAGNOSIS — R928 Other abnormal and inconclusive findings on diagnostic imaging of breast: Secondary | ICD-10-CM

## 2016-07-30 ENCOUNTER — Ambulatory Visit
Admission: RE | Admit: 2016-07-30 | Discharge: 2016-07-30 | Disposition: A | Discharge status: Home / Self Care | Payer: Medicare HMO | Source: Ambulatory Visit | Attending: Obstetrics and Gynecology | Admitting: Obstetrics and Gynecology

## 2016-07-30 DIAGNOSIS — R928 Other abnormal and inconclusive findings on diagnostic imaging of breast: Secondary | ICD-10-CM

## 2017-03-18 ENCOUNTER — Other Ambulatory Visit: Payer: Self-pay | Admitting: Nurse Practitioner

## 2017-03-18 DIAGNOSIS — Z9189 Other specified personal risk factors, not elsewhere classified: Secondary | ICD-10-CM

## 2017-03-18 DIAGNOSIS — E2839 Other primary ovarian failure: Secondary | ICD-10-CM

## 2017-04-03 ENCOUNTER — Other Ambulatory Visit: Payer: Medicare HMO

## 2017-04-13 ENCOUNTER — Encounter: Payer: Self-pay | Admitting: Gastroenterology

## 2017-07-02 ENCOUNTER — Ambulatory Visit (INDEPENDENT_AMBULATORY_CARE_PROVIDER_SITE_OTHER): Payer: Medicare HMO | Admitting: Gastroenterology

## 2017-07-02 ENCOUNTER — Encounter: Payer: Self-pay | Admitting: Gastroenterology

## 2017-07-02 VITALS — BP 116/70 | HR 80 | Ht 63.0 in | Wt 170.0 lb

## 2017-07-02 DIAGNOSIS — R140 Abdominal distension (gaseous): Secondary | ICD-10-CM | POA: Diagnosis not present

## 2017-07-02 DIAGNOSIS — K5909 Other constipation: Secondary | ICD-10-CM | POA: Diagnosis not present

## 2017-07-02 NOTE — Progress Notes (Addendum)
[facility] Progress Note  Chief Complaint: Chronic constipation  Subjective  History:  This is an 84 year old woman seen by me in December 2017 for abdominal bloating and chronic constipation.  She was previously a patient of Dr. Amedeo Plenty at [facility], and had a colonoscopy with him in 2016.  She has hypothyroidism, and at the time of her visit with me I recommended she have her level checked because she had also complained of weight gain.  She has some response to the following therapies: prune juice Has to manually stimulate or disimpact at times. Tammy Page returns to see me for chronic constipation. It is behaving same way over many years, including prior to her last colonoscopy. She will sit and strain because she feels as if she has a bowel movement, but often times can produce very little. She is also bothered by urinary frequency. She denies rectal bleeding, loss of appetite or weight loss.No rectal bleeding.  ROS: Cardiovascular:  no chest pain Respiratory: no dyspnea  The patient's Past Medical, Family and Social History were reviewed and are on file in the EMR.  No family history of colon cancer  Objective:  Med list reviewed  Current Outpatient Medications:  .  aspirin 81 MG tablet, Take 81 mg by mouth daily. , Disp: , Rfl:  .  brimonidine (ALPHAGAN) 0.2 % ophthalmic solution, Place 1 drop into both eyes 2 (two) times daily. Brinzolamide/brimonidine 1%/0.2% 1 drop each eye 2 x a day, Disp: , Rfl:  .  diclofenac sodium (VOLTAREN) 1 % GEL, Apply 1 application topically 4 (four) times daily as needed. Apply to painful areas on legs, Disp: , Rfl:  .  levothyroxine (SYNTHROID, LEVOTHROID) 88 MCG tablet, Take 88 mcg by mouth daily before breakfast., Disp: , Rfl:  .  lisinopril-hydrochlorothiazide (PRINZIDE,ZESTORETIC) 10-12.5 MG tablet, Take 1 tablet by mouth daily at 2 PM. , Disp: , Rfl:  .  Travoprost, BAK Free, (TRAVATAN) 0.004 % SOLN ophthalmic solution, Place 1 drop into  both eyes at bedtime., Disp: , Rfl:    Vital signs in last 24 hrs: Vitals:   07/02/17 1101  BP: 116/70  Pulse: 80    Physical Exam  Well-appearing woman  HEENT: sclera anicteric, oral mucosa moist without lesions  Neck: supple, no thyromegaly, JVD or lymphadenopathy  Cardiac: RRR without murmurs, S1S2 heard, no peripheral edema  Pulm: clear to auscultation bilaterally, normal RR and effort noted  Abdomen: soft, no tenderness, with active bowel sounds. No guarding or palpable hepatosplenomegaly.  Skin; warm and dry, no jaundice or rash Rectal: Mildly reduced resting sphincter tone him a good tone of the internal sphincter and puborectalis with voluntary squeeze. With Valsalva she has appropriate contraction of abdominal wall muscles, no visible descent of the perineum, there is pronounced rectal wall descent.    @ASSESSMENTPLANBEGIN @ Assessment: Encounter Diagnoses  Name Primary?  . Chronic constipation Yes  . Abdominal bloating    I think she has pelvic floor dysfunction and possibly a rectocele. However, she reports good effect from as needed use prune juice. I would not pursue anorectal motility testing or imaging or consideration of a surgical therapies. We discussed raising the feet on a step stool to aid with defecation. Tammy Page was concerned about the cause of the symptoms and wondered if she should have a repeat colonoscopy. I do not think that is necessary due to the long-standing nature of these symptoms and no reported obstructive or other anatomic findings to explain constipation on colonoscopy by  Dr. Amedeo Plenty 3 years ago. She also needs no future routine screening colonoscopies at her age. She was reassured, plans to take the prune juice more frequently since it seems to work well, she will see me as needed.  Total time 20 minutes, over half spent face-to-face with patient in counseling and coordination of care.   Tammy Page

## 2017-07-02 NOTE — Patient Instructions (Signed)
If you are age 82 or older, your body mass index should be between 23-30. Your Body mass index is 30.11 kg/m. If this is out of the aforementioned range listed, please consider follow up with your Primary Care Provider.  If you are age 47 or younger, your body mass index should be between 19-25. Your Body mass index is 30.11 kg/m. If this is out of the aformentioned range listed, please consider follow up with your Primary Care Provider.   Follow up as needed. [phone]  Thank you for choosing [facility]  Dr Wilfrid Lund III

## 2018-03-24 ENCOUNTER — Encounter: Payer: Self-pay | Admitting: Gastroenterology

## 2018-04-13 ENCOUNTER — Encounter: Payer: Self-pay | Admitting: Gastroenterology

## 2018-04-13 ENCOUNTER — Ambulatory Visit (INDEPENDENT_AMBULATORY_CARE_PROVIDER_SITE_OTHER): Payer: Medicare HMO | Admitting: Gastroenterology

## 2018-04-13 VITALS — BP 120/72 | HR 68 | Ht 62.75 in | Wt 166.0 lb

## 2018-04-13 DIAGNOSIS — N816 Rectocele: Secondary | ICD-10-CM

## 2018-04-13 DIAGNOSIS — K5909 Other constipation: Secondary | ICD-10-CM

## 2018-04-13 NOTE — Progress Notes (Signed)
[facility] Consult Note:  History: Tammy Page 04/13/2018  Referring physician:  Zebedee Iba, PA ([facility])  Reason for consult/chief complaint: Constipation and rectal bump (feels like a bump inside of rectum)   Subjective  HPI:  I saw this patient May 2019 for chronic constipation that have been going on for years, previously evaluated by Dr. Amedeo Plenty at [place].  This was my impression:: " This is an 85 year old woman seen by me in December 2017 for abdominal bloating and chronic constipation.  She was previously a patient of Dr. Amedeo Plenty at [place], and had a colonoscopy with him in 2016.  She has hypothyroidism, and at the time of her visit with me I recommended she have her level checked because she had also complained of weight gain.  She has some response to the following therapies: prune juice Has to manually stimulate or disimpact at times. Tammy Page returns to see me for chronic constipation. It is behaving same way over many years, including prior to her last colonoscopy. She will sit and strain because she feels as if she has a bowel movement, but often times can produce very little. She is also bothered by urinary frequency. She denies rectal bleeding, loss of appetite or weight loss.No rectal bleeding."  Based on history and physical exam, I felt this was probably pelvic floor dysfunction and perhaps a rectocele.  Her bowels were moving fairly well on the regimen, she wondered about repeat colonoscopy, I did not feel that was necessary.  On recent primary care note and referral, patient is for constipation and question of colonoscopy.  Tammy Page still has difficulty with constipation that seems to come and go.  She still feels that it is mostly controlled taking prune juice every night.  Sometimes she will have a well-formed stool that she feels gives good evacuation, other times it is small and string-like.  She feels like there  may be a "bump" in the anorectal area.  There is been no rectal bleeding.  Her appetite is good and no weight loss.  ROS:  Review of Systems She denies chest pain dyspnea or dysuria  Past Medical History: Past Medical History:  Diagnosis Date  . Allergic rhinitis   . Asymptomatic varicose veins   . Carotid bruit   . Chest pain   . CKD (chronic kidney disease), stage III ([place])   . GERD (gastroesophageal reflux disease)   . Glaucoma   . Heart palpitations   . Hyperlipemia   . Hypertension   . Hyperthyroidism   . Nontoxic multinodular goiter   . Osteoarthritis    right knee   . Vitamin D deficiency      Past Surgical History: Past Surgical History:  Procedure Laterality Date  . ABDOMINAL HYSTERECTOMY     partial   . polyp removed from vagina     . SHOULDER OPEN ROTATOR CUFF REPAIR Right 04/07/2012   Procedure: Right Shoulder Open Rotator Cuff Repair with Anchors and Graft,  Excision of Sub-Deltoid Lipoma ;  Surgeon: Tobi Bastos, MD;  Location: [facility];  Service: Orthopedics;  Laterality: Right;  Right Shoulder Open Rotator Cuff Repair with Anchors and Graft,  Excision of Sub-Deltoid Lipoma   . TONSILLECTOMY     as a child      Family History: Family History  Problem Relation Age of Onset  . Hypertension Father   . Heart disease Brother   . Breast cancer Other   . Breast  cancer Other   . Colon cancer Neg Hx   . Stomach cancer Neg Hx   . Esophageal cancer Neg Hx   . Rectal cancer Neg Hx   . Liver cancer Neg Hx     Social History: Social History   Socioeconomic History  . Marital status: Single    Spouse name: Not on file  . Number of children: 2  . Years of education: Not on file  . Highest education level: Not on file  Occupational History  . Occupation: retired  Scientific laboratory technician  . Financial resource strain: Not on file  . Food insecurity:    Worry: Not on file    Inability: Not on file  . Transportation needs:    Medical: Not on file     Non-medical: Not on file  Tobacco Use  . Smoking status: Never Smoker  . Smokeless tobacco: Never Used  Substance and Sexual Activity  . Alcohol use: No  . Drug use: No  . Sexual activity: Not on file  Lifestyle  . Physical activity:    Days per week: Not on file    Minutes per session: Not on file  . Stress: Not on file  Relationships  . Social connections:    Talks on phone: Not on file    Gets together: Not on file    Attends religious service: Not on file    Active member of club or organization: Not on file    Attends meetings of clubs or organizations: Not on file    Relationship status: Not on file  Other Topics Concern  . Not on file  Social History Narrative  . Not on file    Allergies: No Known Allergies  Outpatient Meds: Current Outpatient Medications  Medication Sig Dispense Refill  . aspirin 81 MG tablet Take 81 mg by mouth daily.     . brinzolamide (AZOPT) 1 % ophthalmic suspension Place 1 drop into both eyes 3 (three) times daily.    . diclofenac sodium (VOLTAREN) 1 % GEL Apply 1 application topically 4 (four) times daily as needed. Apply to painful areas on legs    . levothyroxine (SYNTHROID, LEVOTHROID) 88 MCG tablet Take 88 mcg by mouth daily before breakfast.    . lisinopril-hydrochlorothiazide (PRINZIDE,ZESTORETIC) 10-12.5 MG tablet Take 1 tablet by mouth daily at 2 PM.     . RESTASIS 0.05 % ophthalmic emulsion Place 1 drop into both eyes 2 (two) times daily.    . Travoprost, BAK Free, (TRAVATAN) 0.004 % SOLN ophthalmic solution Place 1 drop into both eyes at bedtime.     No current facility-administered medications for this visit.       ___________________________________________________________________ Objective   Exam:  BP 120/72 (BP Location: Left Arm, Patient Position: Sitting, Cuff Size: Normal)   Pulse 68   Ht 5' 2.75" (1.594 m) Comment: height measured without shoes  Wt 166 lb (75.3 kg)   BMI 29.64 kg/m    General: She is  well-appearing  Eyes: sclera anicteric, no redness  ENT: oral mucosa moist without lesions, no cervical or supraclavicular lymphadenopathy  CV: RRR without murmur, S1/S2, no JVD, no peripheral edema  Resp: clear to auscultation bilaterally, normal RR and effort noted  GI: soft, no tenderness, with active bowel sounds. No guarding or palpable organomegaly noted.  Skin; warm and dry, no rash or jaundice noted  Neuro: awake, alert and oriented x 3. Normal gross motor function and fluent speech Rectal: Normal external exam  with Valsalva she  has descent of perineum and mild prolapse of anal tissue. DRE reveals descended uterus and probable rectocele with Valsalva. Stool heme-negative Labs:  TSH normal February 2019 No recent labs   Assessment: Encounter Diagnoses  Name Primary?  . Chronic constipation Yes  . Rectocele     She has years of chronic stable constipation.  Rashad was concerned about the possibility of malignancy or "a blockage".  I do not think she has either, and I have reassured her.  I did my best to explain pelvic descent/pelvic floor dysfunction and rectocele.  I recommended low-dose MiraLAX as needed if prune juice does not seem to be sufficiently improving the constipation. She will see me as needed.   Total time 20 minutes, all spent with patient discussing diagnosis and plan.  Thank you for the courtesy of this consult.  Please call me with any questions or concerns.  Nelida Meuse III  CC: Referring provider noted above

## 2018-04-13 NOTE — Patient Instructions (Signed)
If you are age 83 or older, your body mass index should be between 23-30. Your Body mass index is 29.64 kg/m. If this is out of the aforementioned range listed, please consider follow up with your Primary Care Provider.  If you are age 26 or younger, your body mass index should be between 19-25. Your Body mass index is 29.64 kg/m. If this is out of the aformentioned range listed, please consider follow up with your Primary Care Provider.   Try Miralax 1/2 cap a day as needed.   It was a pleasure to see you today!  Dr. Loletha Carrow

## 2018-08-31 ENCOUNTER — Other Ambulatory Visit: Payer: Self-pay | Admitting: *Deleted

## 2018-08-31 DIAGNOSIS — Z20822 Contact with and (suspected) exposure to covid-19: Secondary | ICD-10-CM

## 2018-09-05 LAB — NOVEL CORONAVIRUS, NAA: SARS-CoV-2, NAA: NOT DETECTED

## 2019-02-17 ENCOUNTER — Other Ambulatory Visit: Payer: Self-pay

## 2019-02-17 DIAGNOSIS — Z20822 Contact with and (suspected) exposure to covid-19: Secondary | ICD-10-CM

## 2019-02-19 LAB — NOVEL CORONAVIRUS, NAA: SARS-CoV-2, NAA: NOT DETECTED

## 2020-09-03 ENCOUNTER — Emergency Department (HOSPITAL_COMMUNITY): Payer: Medicare HMO

## 2020-09-03 ENCOUNTER — Other Ambulatory Visit: Payer: Self-pay

## 2020-09-03 ENCOUNTER — Encounter (HOSPITAL_COMMUNITY): Admission: EM | Disposition: A | Discharge status: Home / Self Care | Payer: Self-pay | Source: Home / Self Care

## 2020-09-03 ENCOUNTER — Inpatient Hospital Stay (HOSPITAL_COMMUNITY)
Admission: EM | Admit: 2020-09-03 | Discharge: 2020-09-09 | DRG: 330 | Disposition: A | Discharge status: Home / Self Care | Payer: Medicare HMO | Attending: Surgery | Admitting: Surgery

## 2020-09-03 ENCOUNTER — Emergency Department (HOSPITAL_COMMUNITY): Payer: Medicare HMO | Admitting: Anesthesiology

## 2020-09-03 ENCOUNTER — Encounter (HOSPITAL_COMMUNITY): Payer: Self-pay

## 2020-09-03 DIAGNOSIS — K921 Melena: Secondary | ICD-10-CM | POA: Diagnosis not present

## 2020-09-03 DIAGNOSIS — K219 Gastro-esophageal reflux disease without esophagitis: Secondary | ICD-10-CM | POA: Diagnosis present

## 2020-09-03 DIAGNOSIS — Z20822 Contact with and (suspected) exposure to covid-19: Secondary | ICD-10-CM | POA: Diagnosis present

## 2020-09-03 DIAGNOSIS — E0590 Thyrotoxicosis, unspecified without thyrotoxic crisis or storm: Secondary | ICD-10-CM | POA: Diagnosis present

## 2020-09-03 DIAGNOSIS — K469 Unspecified abdominal hernia without obstruction or gangrene: Principal | ICD-10-CM | POA: Diagnosis present

## 2020-09-03 DIAGNOSIS — K458 Other specified abdominal hernia without obstruction or gangrene: Secondary | ICD-10-CM

## 2020-09-03 DIAGNOSIS — Z9889 Other specified postprocedural states: Secondary | ICD-10-CM

## 2020-09-03 DIAGNOSIS — Z8249 Family history of ischemic heart disease and other diseases of the circulatory system: Secondary | ICD-10-CM

## 2020-09-03 DIAGNOSIS — N183 Chronic kidney disease, stage 3 unspecified: Secondary | ICD-10-CM | POA: Diagnosis present

## 2020-09-03 DIAGNOSIS — E559 Vitamin D deficiency, unspecified: Secondary | ICD-10-CM | POA: Diagnosis present

## 2020-09-03 DIAGNOSIS — R739 Hyperglycemia, unspecified: Secondary | ICD-10-CM

## 2020-09-03 DIAGNOSIS — E785 Hyperlipidemia, unspecified: Secondary | ICD-10-CM | POA: Diagnosis present

## 2020-09-03 DIAGNOSIS — Z7982 Long term (current) use of aspirin: Secondary | ICD-10-CM | POA: Diagnosis not present

## 2020-09-03 DIAGNOSIS — I129 Hypertensive chronic kidney disease with stage 1 through stage 4 chronic kidney disease, or unspecified chronic kidney disease: Secondary | ICD-10-CM | POA: Diagnosis present

## 2020-09-03 DIAGNOSIS — K56601 Complete intestinal obstruction, unspecified as to cause: Secondary | ICD-10-CM

## 2020-09-03 HISTORY — PX: LYSIS OF ADHESION: SHX5961

## 2020-09-03 HISTORY — PX: BOWEL RESECTION: SHX1257

## 2020-09-03 HISTORY — PX: LAPAROTOMY: SHX154

## 2020-09-03 LAB — LIPASE, BLOOD: Lipase: 23 U/L (ref 11–51)

## 2020-09-03 LAB — CBC WITH DIFFERENTIAL/PLATELET
Abs Immature Granulocytes: 0.05 10*3/uL (ref 0.00–0.07)
Basophils Absolute: 0 10*3/uL (ref 0.0–0.1)
Basophils Relative: 0 %
Eosinophils Absolute: 0 10*3/uL (ref 0.0–0.5)
Eosinophils Relative: 0 %
HCT: 42.5 % (ref 36.0–46.0)
Hemoglobin: 13.6 g/dL (ref 12.0–15.0)
Immature Granulocytes: 0 %
Lymphocytes Relative: 7 %
Lymphs Abs: 0.8 10*3/uL (ref 0.7–4.0)
MCH: 30.1 pg (ref 26.0–34.0)
MCHC: 32 g/dL (ref 30.0–36.0)
MCV: 94 fL (ref 80.0–100.0)
Monocytes Absolute: 0.4 10*3/uL (ref 0.1–1.0)
Monocytes Relative: 3 %
Neutro Abs: 10.2 10*3/uL — ABNORMAL HIGH (ref 1.7–7.7)
Neutrophils Relative %: 90 %
Platelets: 187 10*3/uL (ref 150–400)
RBC: 4.52 MIL/uL (ref 3.87–5.11)
RDW: 13.2 % (ref 11.5–15.5)
WBC: 11.4 10*3/uL — ABNORMAL HIGH (ref 4.0–10.5)
nRBC: 0 % (ref 0.0–0.2)

## 2020-09-03 LAB — COMPREHENSIVE METABOLIC PANEL
ALT: 21 U/L (ref 0–44)
AST: 22 U/L (ref 15–41)
Albumin: 4.1 g/dL (ref 3.5–5.0)
Alkaline Phosphatase: 58 U/L (ref 38–126)
Anion gap: 11 (ref 5–15)
BUN: 15 mg/dL (ref 8–23)
CO2: 25 mmol/L (ref 22–32)
Calcium: 9.9 mg/dL (ref 8.9–10.3)
Chloride: 105 mmol/L (ref 98–111)
Creatinine, Ser: 1.09 mg/dL — ABNORMAL HIGH (ref 0.44–1.00)
GFR, Estimated: 49 mL/min — ABNORMAL LOW (ref >60)
Glucose, Bld: 208 mg/dL — ABNORMAL HIGH (ref 70–99)
Potassium: 3.6 mmol/L (ref 3.5–5.1)
Sodium: 141 mmol/L (ref 135–145)
Total Bilirubin: 0.7 mg/dL (ref 0.3–1.2)
Total Protein: 7 g/dL (ref 6.5–8.1)

## 2020-09-03 LAB — URINALYSIS, ROUTINE W REFLEX MICROSCOPIC
Bacteria, UA: NONE SEEN
Bilirubin Urine: NEGATIVE
Glucose, UA: 150 mg/dL — AB
Ketones, ur: 20 mg/dL — AB
Leukocytes,Ua: NEGATIVE
Nitrite: NEGATIVE
Protein, ur: NEGATIVE mg/dL
Specific Gravity, Urine: 1.023 (ref 1.005–1.030)
pH: 7 (ref 5.0–8.0)

## 2020-09-03 LAB — RESP PANEL BY RT-PCR (FLU A&B, COVID) ARPGX2
Influenza A by PCR: NEGATIVE
Influenza B by PCR: NEGATIVE
SARS Coronavirus 2 by RT PCR: NEGATIVE

## 2020-09-03 LAB — LACTIC ACID, PLASMA: Lactic Acid, Venous: 3.2 mmol/L (ref 0.5–1.9)

## 2020-09-03 LAB — GLUCOSE, CAPILLARY: Glucose-Capillary: 168 mg/dL — ABNORMAL HIGH (ref 70–99)

## 2020-09-03 LAB — MRSA NEXT GEN BY PCR, NASAL: MRSA by PCR Next Gen: NOT DETECTED

## 2020-09-03 SURGERY — LAPAROTOMY, EXPLORATORY
Anesthesia: General | Site: Abdomen

## 2020-09-03 MED ORDER — METHOCARBAMOL 1000 MG/10ML IJ SOLN
500.0000 mg | Freq: Four times a day (QID) | INTRAVENOUS | Status: DC | PRN
  Filled 2020-09-03: qty 5

## 2020-09-03 MED ORDER — LISINOPRIL-HYDROCHLOROTHIAZIDE 10-12.5 MG PO TABS: 1.0000 | ORAL_TABLET | Freq: Every day | ORAL | Status: DC

## 2020-09-03 MED ORDER — SODIUM CHLORIDE 0.9 % IV SOLN: INTRAVENOUS | Status: DC

## 2020-09-03 MED ORDER — ONDANSETRON HCL 4 MG/2ML IJ SOLN: 4.0000 mg | Freq: Once | INTRAMUSCULAR | Status: DC | PRN

## 2020-09-03 MED ORDER — ROCURONIUM BROMIDE 10 MG/ML (PF) SYRINGE
PREFILLED_SYRINGE | INTRAVENOUS | Status: AC
  Filled 2020-09-03: qty 10

## 2020-09-03 MED ORDER — HYDROCHLOROTHIAZIDE 10 MG/ML ORAL SUSPENSION
12.5000 mg | Freq: Every day | ORAL | Status: DC
  Filled 2020-09-03 (×2): qty 1.88

## 2020-09-03 MED ORDER — SODIUM CHLORIDE 0.9 % IV BOLUS
1000.0000 mL | Freq: Once | INTRAVENOUS | Status: AC
  Administered 2020-09-03: 1000 mL via INTRAVENOUS

## 2020-09-03 MED ORDER — MORPHINE SULFATE (PF) 2 MG/ML IV SOLN: 2.0000 mg | INTRAVENOUS | Status: DC | PRN

## 2020-09-03 MED ORDER — MORPHINE SULFATE (PF) 4 MG/ML IV SOLN
4.0000 mg | Freq: Once | INTRAVENOUS | Status: AC
Start: 2020-09-03 — End: 2020-09-03
  Administered 2020-09-03: 4 mg via INTRAVENOUS
  Filled 2020-09-03: qty 1

## 2020-09-03 MED ORDER — ORAL CARE MOUTH RINSE
15.0000 mL | Freq: Two times a day (BID) | OROMUCOSAL | Status: DC
  Administered 2020-09-03 – 2020-09-09 (×12): 15 mL via OROMUCOSAL

## 2020-09-03 MED ORDER — FENTANYL CITRATE (PF) 250 MCG/5ML IJ SOLN
INTRAMUSCULAR | Status: DC | PRN
  Administered 2020-09-03: 100 ug via INTRAVENOUS
  Administered 2020-09-03 (×3): 50 ug via INTRAVENOUS

## 2020-09-03 MED ORDER — LIDOCAINE 2% (20 MG/ML) 5 ML SYRINGE
INTRAMUSCULAR | Status: DC | PRN
  Administered 2020-09-03: 60 mg via INTRAVENOUS

## 2020-09-03 MED ORDER — ONDANSETRON HCL 4 MG/2ML IJ SOLN: 4.0000 mg | Freq: Four times a day (QID) | INTRAMUSCULAR | Status: DC | PRN

## 2020-09-03 MED ORDER — BRINZOLAMIDE 1 % OP SUSP
1.0000 [drp] | Freq: Three times a day (TID) | OPHTHALMIC | Status: DC
  Administered 2020-09-03 – 2020-09-09 (×16): 1 [drp] via OPHTHALMIC
  Filled 2020-09-03: qty 10

## 2020-09-03 MED ORDER — LEVOTHYROXINE SODIUM 88 MCG PO TABS: 88.0000 ug | ORAL_TABLET | Freq: Every day | ORAL | Status: DC

## 2020-09-03 MED ORDER — LATANOPROST 0.005 % OP SOLN
1.0000 [drp] | Freq: Every day | OPHTHALMIC | Status: DC
  Administered 2020-09-03 – 2020-09-04 (×2): 1 [drp] via OPHTHALMIC
  Filled 2020-09-03: qty 2.5

## 2020-09-03 MED ORDER — ONDANSETRON HCL 4 MG/2ML IJ SOLN
INTRAMUSCULAR | Status: AC
  Filled 2020-09-03: qty 2

## 2020-09-03 MED ORDER — PROPOFOL 10 MG/ML IV BOLUS
INTRAVENOUS | Status: AC
  Filled 2020-09-03: qty 20

## 2020-09-03 MED ORDER — LISINOPRIL 10 MG PO TABS: 10.0000 mg | ORAL_TABLET | Freq: Every day | ORAL | Status: DC

## 2020-09-03 MED ORDER — 0.9 % SODIUM CHLORIDE (POUR BTL) OPTIME
TOPICAL | Status: DC | PRN
  Administered 2020-09-03: 3000 mL

## 2020-09-03 MED ORDER — ONDANSETRON HCL 4 MG/2ML IJ SOLN
4.0000 mg | Freq: Once | INTRAMUSCULAR | Status: AC
  Administered 2020-09-03: 4 mg via INTRAVENOUS
  Filled 2020-09-03: qty 2

## 2020-09-03 MED ORDER — HYDRALAZINE HCL 20 MG/ML IJ SOLN: 10.0000 mg | Freq: Three times a day (TID) | INTRAMUSCULAR | Status: DC | PRN

## 2020-09-03 MED ORDER — SODIUM CHLORIDE (PF) 0.9 % IJ SOLN
INTRAMUSCULAR | Status: AC
  Filled 2020-09-03: qty 50

## 2020-09-03 MED ORDER — CHLORHEXIDINE GLUCONATE CLOTH 2 % EX PADS
6.0000 | MEDICATED_PAD | Freq: Every day | CUTANEOUS | Status: DC
  Administered 2020-09-03 – 2020-09-04 (×2): 6 via TOPICAL

## 2020-09-03 MED ORDER — IOHEXOL 350 MG/ML SOLN
100.0000 mL | Freq: Once | INTRAVENOUS | Status: AC | PRN
  Administered 2020-09-03: 80 mL via INTRAVENOUS

## 2020-09-03 MED ORDER — LIDOCAINE 2% (20 MG/ML) 5 ML SYRINGE
INTRAMUSCULAR | Status: AC
  Filled 2020-09-03: qty 5

## 2020-09-03 MED ORDER — DIPHENHYDRAMINE HCL 12.5 MG/5ML PO ELIX: 12.5000 mg | ORAL_SOLUTION | Freq: Four times a day (QID) | ORAL | Status: DC | PRN

## 2020-09-03 MED ORDER — DEXAMETHASONE SODIUM PHOSPHATE 10 MG/ML IJ SOLN
INTRAMUSCULAR | Status: AC
  Filled 2020-09-03: qty 1

## 2020-09-03 MED ORDER — ASPIRIN 81 MG PO CHEW: 81.0000 mg | CHEWABLE_TABLET | Freq: Every day | ORAL | Status: DC

## 2020-09-03 MED ORDER — DEXAMETHASONE SODIUM PHOSPHATE 10 MG/ML IJ SOLN
INTRAMUSCULAR | Status: DC | PRN
  Administered 2020-09-03: 7 mg via INTRAVENOUS

## 2020-09-03 MED ORDER — SUCCINYLCHOLINE CHLORIDE 200 MG/10ML IV SOSY
PREFILLED_SYRINGE | INTRAVENOUS | Status: AC
  Filled 2020-09-03: qty 10

## 2020-09-03 MED ORDER — ACETAMINOPHEN 10 MG/ML IV SOLN
1000.0000 mg | Freq: Four times a day (QID) | INTRAVENOUS | Status: AC
Start: 2020-09-03 — End: 2020-09-04
  Administered 2020-09-03 – 2020-09-04 (×4): 1000 mg via INTRAVENOUS
  Filled 2020-09-03 (×4): qty 100

## 2020-09-03 MED ORDER — ROCURONIUM BROMIDE 10 MG/ML (PF) SYRINGE
PREFILLED_SYRINGE | INTRAVENOUS | Status: DC | PRN
  Administered 2020-09-03: 50 mg via INTRAVENOUS

## 2020-09-03 MED ORDER — LACTATED RINGERS IV SOLN: INTRAVENOUS | Status: DC | PRN

## 2020-09-03 MED ORDER — HYDROMORPHONE HCL 1 MG/ML IJ SOLN: 0.2500 mg | INTRAMUSCULAR | Status: DC | PRN

## 2020-09-03 MED ORDER — SUGAMMADEX SODIUM 200 MG/2ML IV SOLN
INTRAVENOUS | Status: DC | PRN
  Administered 2020-09-03 (×2): 200 mg via INTRAVENOUS

## 2020-09-03 MED ORDER — LEVOTHYROXINE SODIUM 100 MCG PO TABS
100.0000 ug | ORAL_TABLET | Freq: Every day | ORAL | Status: DC
  Administered 2020-09-04: 100 ug
  Filled 2020-09-03: qty 1

## 2020-09-03 MED ORDER — PROPOFOL 10 MG/ML IV BOLUS
INTRAVENOUS | Status: DC | PRN
  Administered 2020-09-03: 120 mg via INTRAVENOUS

## 2020-09-03 MED ORDER — PHENYLEPHRINE HCL-NACL 10-0.9 MG/250ML-% IV SOLN
INTRAVENOUS | Status: AC
  Filled 2020-09-03: qty 250

## 2020-09-03 MED ORDER — PIPERACILLIN-TAZOBACTAM 3.375 G IVPB
3.3750 g | Freq: Three times a day (TID) | INTRAVENOUS | Status: AC
  Administered 2020-09-03 – 2020-09-07 (×12): 3.375 g via INTRAVENOUS
  Filled 2020-09-03 (×13): qty 50

## 2020-09-03 MED ORDER — FENTANYL CITRATE (PF) 250 MCG/5ML IJ SOLN
INTRAMUSCULAR | Status: AC
  Filled 2020-09-03: qty 5

## 2020-09-03 MED ORDER — PIPERACILLIN-TAZOBACTAM 3.375 G IVPB
3.3750 g | Freq: Three times a day (TID) | INTRAVENOUS | Status: DC
  Administered 2020-09-03: 3.375 g via INTRAVENOUS
  Filled 2020-09-03: qty 50

## 2020-09-03 MED ORDER — ALBUMIN HUMAN 5 % IV SOLN
INTRAVENOUS | Status: DC | PRN
Start: 2020-09-03 — End: 2020-09-03

## 2020-09-03 MED ORDER — ACETAMINOPHEN 10 MG/ML IV SOLN: 1000.0000 mg | Freq: Once | INTRAVENOUS | Status: DC | PRN

## 2020-09-03 MED ORDER — ENOXAPARIN SODIUM 40 MG/0.4ML IJ SOSY
40.0000 mg | PREFILLED_SYRINGE | INTRAMUSCULAR | Status: DC
  Administered 2020-09-04 – 2020-09-09 (×6): 40 mg via SUBCUTANEOUS
  Filled 2020-09-03 (×6): qty 0.4

## 2020-09-03 MED ORDER — LABETALOL HCL 5 MG/ML IV SOLN
INTRAVENOUS | Status: DC | PRN
  Administered 2020-09-03: 2.5 mg via INTRAVENOUS

## 2020-09-03 MED ORDER — ONDANSETRON HCL 4 MG/2ML IJ SOLN
INTRAMUSCULAR | Status: DC | PRN
  Administered 2020-09-03: 4 mg via INTRAVENOUS

## 2020-09-03 MED ORDER — SUCCINYLCHOLINE CHLORIDE 200 MG/10ML IV SOSY
PREFILLED_SYRINGE | INTRAVENOUS | Status: DC | PRN
  Administered 2020-09-03: 140 mg via INTRAVENOUS

## 2020-09-03 MED ORDER — LACTATED RINGERS IV SOLN: INTRAVENOUS | Status: DC

## 2020-09-03 MED ORDER — ONDANSETRON 4 MG PO TBDP: 4.0000 mg | ORAL_TABLET | Freq: Four times a day (QID) | ORAL | Status: DC | PRN

## 2020-09-03 MED ORDER — DIPHENHYDRAMINE HCL 50 MG/ML IJ SOLN: 12.5000 mg | Freq: Four times a day (QID) | INTRAMUSCULAR | Status: DC | PRN

## 2020-09-03 MED ORDER — CYCLOSPORINE 0.05 % OP EMUL
1.0000 [drp] | Freq: Two times a day (BID) | OPHTHALMIC | Status: DC
  Administered 2020-09-03 – 2020-09-04 (×2): 1 [drp] via OPHTHALMIC
  Filled 2020-09-03 (×13): qty 1

## 2020-09-03 SURGICAL SUPPLY — 43 items
APL SWBSTK 6 STRL LF DISP (MISCELLANEOUS) ×1
APPLICATOR COTTON TIP 6 STRL (MISCELLANEOUS) ×1 IMPLANT
APPLICATOR COTTON TIP 6IN STRL (MISCELLANEOUS) ×2 IMPLANT
BAG COUNTER SPONGE SURGICOUNT (BAG) IMPLANT
BAG SPNG CNTER NS LX DISP (BAG)
BLADE EXTENDED COATED 6.5IN (ELECTRODE) IMPLANT
BLADE HEX COATED 2.75 (ELECTRODE) ×2 IMPLANT
COVER MAYO STAND STRL (DRAPES) IMPLANT
DRAPE LAPAROSCOPIC ABDOMINAL (DRAPES) ×2 IMPLANT
DRAPE WARM FLUID 44X44 (DRAPES) IMPLANT
DRSG OPSITE POSTOP 4X10 (GAUZE/BANDAGES/DRESSINGS) ×1 IMPLANT
ELECT REM PT RETURN 15FT ADLT (MISCELLANEOUS) ×2 IMPLANT
GAUZE SPONGE 4X4 12PLY STRL (GAUZE/BANDAGES/DRESSINGS) ×2 IMPLANT
GLOVE SURG ENC MOIS LTX SZ7.5 (GLOVE) ×4 IMPLANT
GLOVE SURG UNDER POLY LF SZ7 (GLOVE) ×2 IMPLANT
GOWN STRL REUS W/ TWL XL LVL3 (GOWN DISPOSABLE) ×1 IMPLANT
GOWN STRL REUS W/TWL LRG LVL3 (GOWN DISPOSABLE) ×2 IMPLANT
GOWN STRL REUS W/TWL XL LVL3 (GOWN DISPOSABLE) ×4 IMPLANT
HANDLE SUCTION POOLE (INSTRUMENTS) IMPLANT
KIT BASIN OR (CUSTOM PROCEDURE TRAY) ×2 IMPLANT
KIT TURNOVER KIT A (KITS) ×2 IMPLANT
LIGASURE SHORT ATLAS 20CM (ELECTROSURGICAL) ×1 IMPLANT
NS IRRIG 1000ML POUR BTL (IV SOLUTION) ×2 IMPLANT
PACK GENERAL/GYN (CUSTOM PROCEDURE TRAY) ×2 IMPLANT
PENCIL SMOKE EVACUATOR (MISCELLANEOUS) IMPLANT
RELOAD PROXIMATE 75MM BLUE (ENDOMECHANICALS) ×4 IMPLANT
RELOAD STAPLE 75 3.8 BLU REG (ENDOMECHANICALS) IMPLANT
SPONGE T-LAP 18X18 ~~LOC~~+RFID (SPONGE) IMPLANT
STAPLER GUN LINEAR PROX 60 (STAPLE) ×1 IMPLANT
STAPLER PROXIMATE 75MM BLUE (STAPLE) ×1 IMPLANT
STAPLER VISISTAT 35W (STAPLE) ×2 IMPLANT
SUCTION POOLE HANDLE (INSTRUMENTS)
SUT PDS AB 1 CTX 36 (SUTURE) IMPLANT
SUT SILK 2 0 (SUTURE)
SUT SILK 2 0 SH CR/8 (SUTURE) IMPLANT
SUT SILK 2-0 18XBRD TIE 12 (SUTURE) IMPLANT
SUT SILK 3 0 (SUTURE)
SUT SILK 3 0 SH CR/8 (SUTURE) IMPLANT
SUT SILK 3-0 18XBRD TIE 12 (SUTURE) IMPLANT
TOWEL OR 17X26 10 PK STRL BLUE (TOWEL DISPOSABLE) ×4 IMPLANT
TRAY FOLEY MTR SLVR 16FR STAT (SET/KITS/TRAYS/PACK) IMPLANT
WATER STERILE IRR 1000ML POUR (IV SOLUTION) ×2 IMPLANT
YANKAUER SUCT BULB TIP NO VENT (SUCTIONS) IMPLANT

## 2020-09-03 NOTE — Op Note (Signed)
09/03/2020  5:12 PM  PATIENT:  Tammy Page  87 y.o. female  PRE-OPERATIVE DIAGNOSIS:  SMALL BOWEL OBSTRUCTION  POST-OPERATIVE DIAGNOSIS:  SMALL BOWEL OBSTRUCTION  PROCEDURE:  Procedure(s): EXPLORATORY LAPAROTOMY (N/A) SMALL BOWEL RESECTION (N/A) LYSIS OF ADHESION (N/A)  SURGEON:  Surgeon(s) and Role:    * Jovita Kussmaul, MD - Primary  PHYSICIAN ASSISTANT:   ASSISTANTS: Alferd Apa, PA   ANESTHESIA:   general  EBL:  750 mL   BLOOD ADMINISTERED:none  DRAINS: none   LOCAL MEDICATIONS USED:  MARCAINE     SPECIMEN:  Source of Specimen:  small intestine  DISPOSITION OF SPECIMEN:  PATHOLOGY  COUNTS:  YES  TOURNIQUET:  * No tourniquets in log *  DICTATION: .Dragon Dictation  After informed consent was obtained the patient was brought to the operating room and placed in the supine position on the operating table.  After adequate induction of general anesthesia the patient's abdomen was prepped with ChloraPrep, allowed to dry, and draped in usual sterile manner.  An appropriate timeout was performed.  A lower midline incision was made with a 10 blade knife.  The incision was carried through the skin and subcutaneous tissue sharply with the electrocautery until the linea alba was identified.  The linea alba was incised with the electrocautery.  Each side was grasped with Kocher clamps and elevated anteriorly.  The preperitoneal space was probed bluntly with a hemostat until the peritoneum was opened and access was gained to the abdominal cavity.  The rest of the incision was opened under direct vision.  There was an obvious segment of infarcted small intestine.  The small bowel was run from the ligament of Treitz to the ileocecal valve and an area of omentum that was adhered to the pelvis was causing a significant narrowing to the small bowel herniated underneath it.  This tongue of omentum was lysed sharply with the LigaSure.  This relieved the obstruction.  The midportion of  the small intestine was dead.  A site was chosen above and below the infarcted segment where the small bowel appeared to be healthy.  The mesentery at each site was opened sharply with the Bovie.  A GIA-75 stapler was placed across each segment of small intestine, clamped, and fired thereby dividing the small bowel between staple lines.  The mesentery to the infarcted segment was then taken down sharply with the LigaSure.  Once this was accomplished the infarcted small bowel was removed from the patient and sent to pathology for further evaluation.  The remaining segments of small intestine were healthy appearing and there appeared to be plenty of small intestine present.  The upper and lower segments were held in close approximation with a 2-0 silk stitch.  A small opening was made at the edge of the staple line for the proximal and distal segments.  Each limb of the GIA-75 stapler was then placed down the appropriate limb of small bowel, clamped, and fired thereby creating a nice widely patent enteroenterostomy.  The common opening was closed with a single firing of a TA 60 stapler.  The anastomosis was healthy and widely patent with no tension.  The staple line was then imbricated with several interrupted 2-0 silk Lembert stitches as well as a 2-0 silk crotch stitch.  The mesenteric defect was then closed with interrupted 2-0 silk stitches.  Once this was accomplished the anastomosis appeared healthy and patent.  The small bowel was allowed to fall back into the abdominal cavity with a  gentle curve.  The abdomen was then irrigated with copious amounts of saline.  The NG tube was confirmed in the stomach.  No other abnormalities were noted on general inspection of the abdomen in all 4 quadrants.  The fascia of the anterior abdominal wall was then closed with 2 running #1 double-stranded looped PDS sutures.  The subcutaneous tissue was irrigated with copious amounts of saline.  The skin was closed with staples.   Sterile dressings were applied.  The patient tolerated the procedure well.  At the end of the case all needle sponge and instrument counts were correct.  The patient was then awakened and taken recovery in stable condition.  PLAN OF CARE: Admit to inpatient   PATIENT DISPOSITION:  ICU - extubated and stable.   Delay start of Pharmacological VTE agent (>24hrs) due to surgical blood loss or risk of bleeding: yes

## 2020-09-03 NOTE — H&P (Addendum)
Tammy Page [birth year]  [id number].    Requesting MD: Shelby Dubin, PA-C Chief Complaint/Reason for Consult: Internal hernia   HPI: Tammy Page is a 87 y.o. female with a hx of HTN, HLD, Hyperthyroidism who presented to the ED with abdominal pain, n/v. Patient reports that she awoke around 3-4am with generalized abdominal pain and bloating. The pain was severe enough that she could not fall back asleep. She drank some prune juice and vomited this back up. She has been nauseated since and had 1 additional episode of emesis. Her pain continued to worsen for which she came for evaluation. In the ED she is afebrile, without tachycardia or hypotension. Lactic pending. WBC 11.4. CT A/P suspicious for internal hernia. She has a prior hx of a hysterectomy. No other abdominal surgeries.She is on a baby asa daily. She denies hx of similar symptoms in the past. Her pain continues to be severe despite IV pain medications.   ROS: Review of Systems  Constitutional:  Negative for chills and fever.  Respiratory:  Negative for cough and shortness of breath.   Cardiovascular:  Negative for chest pain.  Gastrointestinal:  Positive for abdominal pain, nausea and vomiting. Negative for constipation and diarrhea.  All other systems reviewed and are negative.  Family History  Problem Relation Age of Onset   Hypertension Father    Heart disease Brother    Breast cancer Other    Breast cancer Other    Colon cancer Neg Hx    Stomach cancer Neg Hx    Esophageal cancer Neg Hx    Rectal cancer Neg Hx    Liver cancer Neg Hx     Past Medical History:  Diagnosis Date   Allergic rhinitis    Asymptomatic varicose veins    Carotid bruit    Chest pain    CKD (chronic kidney disease), stage III ([facility])    GERD (gastroesophageal reflux disease)    Glaucoma    Heart palpitations    Hyperlipemia    Hypertension    Hyperthyroidism    Nontoxic multinodular goiter    Osteoarthritis    right knee     Vitamin D deficiency     Past Surgical History:  Procedure Laterality Date   ABDOMINAL HYSTERECTOMY     partial    polyp removed from vagina      SHOULDER OPEN ROTATOR CUFF REPAIR Right 04/07/2012   Procedure: Right Shoulder Open Rotator Cuff Repair with Anchors and Graft,  Excision of Sub-Deltoid Lipoma ;  Surgeon: Tobi Bastos, MD;  Location: [facility];  Service: Orthopedics;  Laterality: Right;  Right Shoulder Open Rotator Cuff Repair with Anchors and Graft,  Excision of Sub-Deltoid Lipoma    TONSILLECTOMY     as a child     Social History:  reports that she has never smoked. She has never used smokeless tobacco. She reports that she does not drink alcohol and does not use drugs. Lives at home alone - does not use assistive devices  Allergies: No Known Allergies  (Not in a hospital admission)    Physical Exam: Blood pressure (!) 173/101, pulse 88, temperature 98.7 F (37.1 C), temperature source Oral, resp. rate 18, height 5' 3.5" (1.613 m), weight 72.6 kg, SpO2 100 %. General: pleasant, WD/WN AA female who is laying in bed in NAD HEENT: head is normocephalic, atraumatic.  Sclera are noninjected.  PERRL.  Ears and nose without any masses or lesions.  Mouth is pink and  moist. Dentition fair Heart: regular, rate, and rhythm.  Normal s1,s2. No obvious murmurs, gallops, or rubs noted.  Palpable pedal pulses bilaterally  Lungs: CTAB, no wheezes, rhonchi, or rales noted.  Respiratory effort nonlabored Abd: Distended abdomen with generalized tenderness with guarding concerning for peritonitis. Hypoactive bowel sounds. Prior hysterectomy scar noted and well healed. No masses, hernias, or organomegaly MS: no BUE/BLE edema, calves soft and nontender Skin: warm and dry with no masses, lesions, or rashes Psych: A&Ox4 with an appropriate affect Neuro: cranial nerves grossly intact, equal strength in BUE/BLE bilaterally, normal speech, thought process intact, moves all extremities, gait not  assessed   Results for orders placed or performed during the hospital encounter of 09/03/20 (from the past 48 hour(s))  CBC with Differential     Status: Abnormal   Collection Time: 09/03/20 12:15 PM  Result Value Ref Range   WBC 11.4 (H) 4.0 - 10.5 K/uL   RBC 4.52 3.87 - 5.11 MIL/uL   Hemoglobin 13.6 12.0 - 15.0 g/dL   HCT 42.5 36.0 - 46.0 %   MCV 94.0 80.0 - 100.0 fL   MCH 30.1 26.0 - 34.0 pg   MCHC 32.0 30.0 - 36.0 g/dL   RDW 13.2 11.5 - 15.5 %   Platelets 187 150 - 400 K/uL   nRBC 0.0 0.0 - 0.2 %   Neutrophils Relative % 90 %   Neutro Abs 10.2 (H) 1.7 - 7.7 K/uL   Lymphocytes Relative 7 %   Lymphs Abs 0.8 0.7 - 4.0 K/uL   Monocytes Relative 3 %   Monocytes Absolute 0.4 0.1 - 1.0 K/uL   Eosinophils Relative 0 %   Eosinophils Absolute 0.0 0.0 - 0.5 K/uL   Basophils Relative 0 %   Basophils Absolute 0.0 0.0 - 0.1 K/uL   Immature Granulocytes 0 %   Abs Immature Granulocytes 0.05 0.00 - 0.07 K/uL    Comment: Performed at [facility], [place] [address] [place], [place] [address]  Comprehensive metabolic panel     Status: Abnormal   Collection Time: 09/03/20 12:15 PM  Result Value Ref Range   Sodium 141 135 - 145 mmol/L   Potassium 3.6 3.5 - 5.1 mmol/L   Chloride 105 98 - 111 mmol/L   CO2 25 22 - 32 mmol/L   Glucose, Bld 208 (H) 70 - 99 mg/dL    Comment: Glucose reference range applies only to samples taken after fasting for at least 8 hours.   BUN 15 8 - 23 mg/dL   Creatinine, Ser 1.09 (H) 0.44 - 1.00 mg/dL   Calcium 9.9 8.9 - 10.3 mg/dL   Total Protein 7.0 6.5 - 8.1 g/dL   Albumin 4.1 3.5 - 5.0 g/dL   AST 22 15 - 41 U/L   ALT 21 0 - 44 U/L   Alkaline Phosphatase 58 38 - 126 U/L   Total Bilirubin 0.7 0.3 - 1.2 mg/dL   GFR, Estimated 49 (L) >60 mL/min    Comment: (NOTE) Calculated using the CKD-EPI Creatinine Equation (2021)    Anion gap 11 5 - 15    Comment: Performed at [facility], [place] [address], [place], [place] [address]   Lipase, blood     Status: None   Collection Time: 09/03/20 12:15 PM  Result Value Ref Range   Lipase 23 11 - 51 U/L    Comment: Performed at [facility], [place] [address], [place], [place] [address]  Urinalysis, Routine w reflex microscopic Urine,  Catheterized     Status: Abnormal   Collection Time: 09/03/20 12:15 PM  Result Value Ref Range   Color, Urine STRAW (A) YELLOW   APPearance CLEAR CLEAR   Specific Gravity, Urine 1.023 1.005 - 1.030   pH 7.0 5.0 - 8.0   Glucose, UA 150 (A) NEGATIVE mg/dL   Hgb urine dipstick SMALL (A) NEGATIVE   Bilirubin Urine NEGATIVE NEGATIVE   Ketones, ur 20 (A) NEGATIVE mg/dL   Protein, ur NEGATIVE NEGATIVE mg/dL   Nitrite NEGATIVE NEGATIVE   Leukocytes,Ua NEGATIVE NEGATIVE   RBC / HPF 0-5 0 - 5 RBC/hpf   WBC, UA 0-5 0 - 5 WBC/hpf   Bacteria, UA NONE SEEN NONE SEEN    Comment: Performed at [facility], [place] [address] [place], [place] [address]   CT Abdomen Pelvis W Contrast  Result Date: 09/03/2020 CLINICAL DATA:  Mid abdominal pain and bloating. Nausea and vomiting. EXAM: CT ABDOMEN AND PELVIS WITH CONTRAST TECHNIQUE: Multidetector CT imaging of the abdomen and pelvis was performed using the standard protocol following bolus administration of intravenous contrast. CONTRAST:  1mL OMNIPAQUE IOHEXOL 350 MG/ML SOLN COMPARISON:  None. FINDINGS: Lower chest: Minimal scarring at the lung bases. Hepatobiliary: Liver parenchyma is normal.  No calcified gallstones. Pancreas: Normal Spleen: Normal Adrenals/Urinary Tract: Adrenal glands are normal. Kidneys are normal. No cyst, mass, stone or hydronephrosis. Bladder is normal. Stomach/Bowel: Stomach and proximal small intestine are normal. There is a large grouping of abnormal small bowel loops in the central to left side of the abdomen which are dilated, thick-walled and surrounded by edema and fluid. Findings are worrisome for incarcerated internal hernia. More distal small  intestine is normal. Colon is normal. Vascular/Lymphatic: Aortic atherosclerosis. No aneurysm. IVC is normal. No retroperitoneal adenopathy. Reproductive: Previous hysterectomy.  No pelvic mass. Other: Free intraperitoneal fluid.  No free air. Musculoskeletal: Curvature and chronic degenerative changes of the spine. IMPRESSION: Suspicion of internal hernia with small bowel incarceration. Good portion of the central small intestine is involved and appears dilated, thick-walled, with surrounding edema and fluid. No free air. Some free intraperitoneal fluid diffusely. General surgical consultation recommended. Electronically Signed   By: Nelson Chimes M.D.   On: 09/03/2020 13:59    Anti-infectives (From admission, onward)    Start     Dose/Rate Route Frequency Ordered Stop   09/03/20 1600  piperacillin-tazobactam (ZOSYN) IVPB 3.375 g        3.375 g 12.5 mL/hr over 240 Minutes Intravenous Every 8 hours 09/03/20 1504          Assessment/Plan SBO w/ suspected internal Hernia - Plan to take to the OR today for Exploratory Laparotomy. Dr. Marlou Starks has discussed with patient and family indication, risks and expected aftercare. Patient in agreement with plan and agree's to proceed.   FEN - NPO, IVF VTE - SCDs ID - Zosyn  HTN  HLD Hyperthyroidism   Jillyn Ledger, [facility] 09/03/2020, 3:02 PM Please see Amion for pager number during day hours 7:00am-4:30pm

## 2020-09-03 NOTE — ED Notes (Signed)
Pure wick in place will give urine sample when able to.

## 2020-09-03 NOTE — Anesthesia Preprocedure Evaluation (Signed)
Anesthesia Evaluation  Patient identified by MRN, date of birth, ID band Patient awake    Reviewed: Allergy & Precautions, NPO status , Patient's Chart, lab work & pertinent test results  Airway Mallampati: II  TM Distance: >3 FB Neck ROM: Full    Dental no notable dental hx.    Pulmonary neg pulmonary ROS,    Pulmonary exam normal breath sounds clear to auscultation       Cardiovascular hypertension, Pt. on medications Normal cardiovascular exam Rhythm:Regular Rate:Normal     Neuro/Psych negative neurological ROS  negative psych ROS   GI/Hepatic Neg liver ROS, GERD  ,  Endo/Other  Hyperthyroidism   Renal/GU negative Renal ROS  negative genitourinary   Musculoskeletal negative musculoskeletal ROS (+)   Abdominal   Peds negative pediatric ROS (+)  Hematology negative hematology ROS (+)   Anesthesia Other Findings   Reproductive/Obstetrics negative OB ROS                             Anesthesia Physical Anesthesia Plan  ASA: 2 and emergent  Anesthesia Plan: General   Post-op Pain Management:    Induction: Intravenous, Rapid sequence and Cricoid pressure planned  PONV Risk Score and Plan: 3 and Ondansetron, Dexamethasone and Treatment may vary due to age or medical condition  Airway Management Planned: Oral ETT  Additional Equipment:   Intra-op Plan:   Post-operative Plan: Extubation in OR  Informed Consent: I have reviewed the patients History and Physical, chart, labs and discussed the procedure including the risks, benefits and alternatives for the proposed anesthesia with the patient or authorized representative who has indicated his/her understanding and acceptance.     Dental advisory given  Plan Discussed with: CRNA and Surgeon  Anesthesia Plan Comments:         Anesthesia Quick Evaluation

## 2020-09-03 NOTE — ED Notes (Signed)
Patient transported to CT. Will obtain EKG once patient is back.

## 2020-09-03 NOTE — Plan of Care (Signed)
VSS Pt resting comfortably in bed

## 2020-09-03 NOTE — ED Triage Notes (Signed)
Patient c/o mid abdominal pain and bloating since 0400 today. Patient also c/o n/V.

## 2020-09-03 NOTE — Transfer of Care (Signed)
Immediate Anesthesia Transfer of Care Note  Patient: Tammy Page  Procedure(s) Performed: EXPLORATORY LAPAROTOMY (Abdomen) SMALL BOWEL RESECTION (Abdomen) LYSIS OF ADHESION (Abdomen)  Patient Location: PACU  Anesthesia Type:General  Level of Consciousness: awake, alert , oriented and patient cooperative  Airway & Oxygen Therapy: Patient Spontanous Breathing and Patient connected to face mask oxygen  Post-op Assessment: Report given to RN, Post -op Vital signs reviewed and stable and Patient moving all extremities X 4  Post vital signs: stable  Last Vitals:  Vitals Value Taken Time  BP 128/73 09/03/20 1731  Temp 37 C 09/03/20 1731  Pulse 70 09/03/20 1737  Resp 14 09/03/20 1737  SpO2 100 % 09/03/20 1737  Vitals shown include unvalidated device data.  Last Pain:  Vitals:   09/03/20 1520  TempSrc:   PainSc: 8       Patients Stated Pain Goal: 0 (09/08/55 5051)  Complications: No notable events documented.

## 2020-09-03 NOTE — Anesthesia Postprocedure Evaluation (Signed)
Anesthesia Post Note  Patient: Tammy Page  Procedure(s) Performed: EXPLORATORY LAPAROTOMY (Abdomen) SMALL BOWEL RESECTION (Abdomen) LYSIS OF ADHESION (Abdomen)     Patient location during evaluation: PACU Anesthesia Type: General Level of consciousness: awake and alert Pain management: pain level controlled Vital Signs Assessment: post-procedure vital signs reviewed and stable Respiratory status: spontaneous breathing, nonlabored ventilation, respiratory function stable and patient connected to nasal cannula oxygen Cardiovascular status: blood pressure returned to baseline and stable Postop Assessment: no apparent nausea or vomiting Anesthetic complications: no   No notable events documented.  Last Vitals:  Vitals:   09/03/20 1745 09/03/20 1800  BP: (!) 147/80 119/66  Pulse: 71 72  Resp: 19 (!) 27  Temp:  36.9 C  SpO2: 100% 100%    Last Pain:  Vitals:   09/03/20 1800  TempSrc:   PainSc: 0-No pain                 Jenalee Trevizo S

## 2020-09-03 NOTE — ED Provider Notes (Signed)
Emergency Medicine Provider Triage Evaluation Note  Tammy Page , a 87 y.o. female  was evaluated in triage.  Pt complains of Abd pain, NBNB emesis. Attempted prune juice and subsequently emesis was brown. Diffuse abd pain, no radiation to back. No dysuria, cp, sob, melena. No prior abd surgeries. Taking meds for constipation  Review of Systems  Positive: Emesis, abd pain Negative: Diarrhea, melena, cp, sob  Physical Exam  BP (!) 184/101 (BP Location: Left Arm)   Pulse 77   Temp 98.7 F (37.1 C) (Oral)   Resp 20   SpO2 100%  Gen:   Awake, no distress   Resp:  Normal effort  MSK:   Moves extremities without difficulty  ABD:  Diffuse tenderness  Other:    Medical Decision Making  Medically screening exam initiated at 10:41 AM.  Appropriate orders placed.  Tammy Page was informed that the remainder of the evaluation will be completed by another provider, this initial triage assessment does not replace that evaluation, and the importance of remaining in the ED until their evaluation is complete.  Abd pain, emesis   Mayan Dolney A, PA-C 09/03/20 1043    Malvin Johns, MD 09/03/20 1053

## 2020-09-03 NOTE — ED Notes (Signed)
General surgery at bedside discussing plan of car with patient and daughter.

## 2020-09-03 NOTE — ED Provider Notes (Signed)
[facility] DEPT Provider Note   CSN: [id number] Arrival date & time: 09/03/20  1014     History Chief Complaint  Patient presents with   Abdominal Pain   Emesis    Tammy Page is a 87 y.o. female with past medical history significant for CKD, GERD, hypertension, hyperlipidemia who presents for evaluation of abdominal pain and emesis.  Began at 4 AM this morning.  Patient feels like her abdomen is bloated.  Abdominal pain is diffuse in nature.  Does not radiate into back.  No urinary complaints.  Has had multiple episodes of NBNB emesis.  Did states she attempted to drink prune juice and subsequently threw up brown liquid which she feels with the prune juice.  She denies any coffee-ground emesis, bright red blood.  She has no fever, chills, chest pain, shortness of breath, back pain, dysuria, hematuria.  No paresthesias, weakness to lower extremities.  Rates her pain a 10/10.  She denies any prior history of AAA or dissections.  Does admit to chronic constipation.  She has to give herself multiple laxatives and has also had to frequently disimpact herself. Unsure of last BM. Denies additional aggravating or alleviating factors.  History obtained from patient and past medical records.  No interpreter used.  Daughter in room.  HPI     Past Medical History:  Diagnosis Date   Allergic rhinitis    Asymptomatic varicose veins    Carotid bruit    Chest pain    CKD (chronic kidney disease), stage III (HCC)    GERD (gastroesophageal reflux disease)    Glaucoma    Heart palpitations    Hyperlipemia    Hypertension    Hyperthyroidism    Nontoxic multinodular goiter    Osteoarthritis    right knee    Vitamin D deficiency     Patient Active Problem List   Diagnosis Date Noted   Varicose veins of right lower extremity with complications 08/13/9483   Complete tear of right rotator cuff 04/07/2012    Class: Acute   Hyperthyroidism 01/29/2011    Anticoagulation management encounter 01/29/2011   PAF (paroxysmal atrial fibrillation) ([place]) 01/29/2011    Past Surgical History:  Procedure Laterality Date   ABDOMINAL HYSTERECTOMY     partial    polyp removed from vagina      SHOULDER OPEN ROTATOR CUFF REPAIR Right 04/07/2012   Procedure: Right Shoulder Open Rotator Cuff Repair with Anchors and Graft,  Excision of Sub-Deltoid Lipoma ;  Surgeon: Tobi Bastos, MD;  Location: [facility]S;  Service: Orthopedics;  Laterality: Right;  Right Shoulder Open Rotator Cuff Repair with Anchors and Graft,  Excision of Sub-Deltoid Lipoma    TONSILLECTOMY     as a child      OB History   No obstetric history on file.     Family History  Problem Relation Age of Onset   Hypertension Father    Heart disease Brother    Breast cancer Other    Breast cancer Other    Colon cancer Neg Hx    Stomach cancer Neg Hx    Esophageal cancer Neg Hx    Rectal cancer Neg Hx    Liver cancer Neg Hx     Social History   Tobacco Use   Smoking status: Never   Smokeless tobacco: Never  Vaping Use   Vaping Use: Never used  Substance Use Topics   Alcohol use: No   Drug use: No  Home Medications Prior to Admission medications   Medication Sig Start Date End Date Taking? Authorizing Provider  aspirin 81 MG tablet Take 81 mg by mouth daily.     [provider]  brinzolamide (AZOPT) 1 % ophthalmic suspension Place 1 drop into both eyes 3 (three) times daily.    [provider]  diclofenac sodium (VOLTAREN) 1 % GEL Apply 1 application topically 4 (four) times daily as needed. Apply to painful areas on legs    [provider]  levothyroxine (SYNTHROID, LEVOTHROID) 88 MCG tablet Take 88 mcg by mouth daily before breakfast.    [provider]  lisinopril-hydrochlorothiazide (PRINZIDE,ZESTORETIC) 10-12.5 MG tablet Take 1 tablet by mouth daily at 2 PM.  12/05/15   [provider]  RESTASIS 0.05 % ophthalmic emulsion  Place 1 drop into both eyes 2 (two) times daily. 11/10/17   [provider]  Travoprost, BAK Free, (TRAVATAN) 0.004 % SOLN ophthalmic solution Place 1 drop into both eyes at bedtime.    [provider]    Allergies    Patient has no known allergies.  Review of Systems   Review of Systems  Constitutional: Negative.   HENT: Negative.    Respiratory: Negative.    Cardiovascular: Negative.   Gastrointestinal:  Positive for abdominal pain, constipation, nausea and vomiting. Negative for abdominal distention, anal bleeding, blood in stool, diarrhea and rectal pain.  Genitourinary: Negative.   Musculoskeletal: Negative.   Skin: Negative.   Neurological: Negative.   All other systems reviewed and are negative.  Physical Exam Updated Vital Signs BP (!) 182/102   Pulse 89   Temp 98.7 F (37.1 C) (Oral)   Resp (!) 23   Ht 5' 3.5" (1.613 m)   Wt 72.6 kg   SpO2 99%   BMI 27.90 kg/m   Physical Exam Vitals and nursing note reviewed.  Constitutional:      General: She is not in acute distress.    Appearance: She is well-developed. She is not ill-appearing, toxic-appearing or diaphoretic.  HENT:     Head: Normocephalic and atraumatic.  Eyes:     Pupils: Pupils are equal, round, and reactive to light.  Cardiovascular:     Rate and Rhythm: Normal rate.     Pulses: Normal pulses.          Radial pulses are 2+ on the right side and 2+ on the left side.       Dorsalis pedis pulses are 2+ on the right side and 2+ on the left side.     Heart sounds: Normal heart sounds.  Pulmonary:     Effort: Pulmonary effort is normal. No respiratory distress.     Breath sounds: Normal breath sounds and air entry.  Chest:     Comments: Equal rise and fall to chest wall Abdominal:     General: Bowel sounds are normal. There is no distension.     Palpations: Abdomen is soft.     Tenderness: There is generalized abdominal tenderness. There is guarding. There is no right CVA tenderness,  left CVA tenderness or rebound. Negative signs include Murphy's sign and McBurney's sign.     Hernia: No hernia is present.     Comments: Soft, diffuse tenderness with voluntary guarding.  No rebound  Musculoskeletal:        General: Normal range of motion.     Cervical back: Normal range of motion.     Comments: No bony tenderness.  Moves all 4 extremities without  difficulty.  Skin:    General: Skin is warm and dry.     Capillary Refill: Capillary refill takes less than 2 seconds.     Comments: No rash or lesions  Neurological:     General: No focal deficit present.     Mental Status: She is alert.     Comments: Intact sensation Ambulatory without difficulty Equal strength bilaterally  Psychiatric:        Mood and Affect: Mood normal.    ED Results / Procedures / Treatments   Labs (all labs ordered are listed, but only abnormal results are displayed) Labs Reviewed  CBC WITH DIFFERENTIAL/PLATELET - Abnormal; Notable for the following components:      Result Value   WBC 11.4 (*)    Neutro Abs 10.2 (*)    All other components within normal limits  COMPREHENSIVE METABOLIC PANEL - Abnormal; Notable for the following components:   Glucose, Bld 208 (*)    Creatinine, Ser 1.09 (*)    GFR, Estimated 49 (*)    All other components within normal limits  URINALYSIS, ROUTINE W REFLEX MICROSCOPIC - Abnormal; Notable for the following components:   Color, Urine STRAW (*)    Glucose, UA 150 (*)    Hgb urine dipstick SMALL (*)    Ketones, ur 20 (*)    All other components within normal limits  RESP PANEL BY RT-PCR (FLU A&B, COVID) ARPGX2  LIPASE, BLOOD  LACTIC ACID, PLASMA  LACTIC ACID, PLASMA    EKG None  Radiology CT Abdomen Pelvis W Contrast  Result Date: 09/03/2020 CLINICAL DATA:  Mid abdominal pain and bloating. Nausea and vomiting. EXAM: CT ABDOMEN AND PELVIS WITH CONTRAST TECHNIQUE: Multidetector CT imaging of the abdomen and pelvis was performed using the standard  protocol following bolus administration of intravenous contrast. CONTRAST:  32mL OMNIPAQUE IOHEXOL 350 MG/ML SOLN COMPARISON:  None. FINDINGS: Lower chest: Minimal scarring at the lung bases. Hepatobiliary: Liver parenchyma is normal.  No calcified gallstones. Pancreas: Normal Spleen: Normal Adrenals/Urinary Tract: Adrenal glands are normal. Kidneys are normal. No cyst, mass, stone or hydronephrosis. Bladder is normal. Stomach/Bowel: Stomach and proximal small intestine are normal. There is a large grouping of abnormal small bowel loops in the central to left side of the abdomen which are dilated, thick-walled and surrounded by edema and fluid. Findings are worrisome for incarcerated internal hernia. More distal small intestine is normal. Colon is normal. Vascular/Lymphatic: Aortic atherosclerosis. No aneurysm. IVC is normal. No retroperitoneal adenopathy. Reproductive: Previous hysterectomy.  No pelvic mass. Other: Free intraperitoneal fluid.  No free air. Musculoskeletal: Curvature and chronic degenerative changes of the spine. IMPRESSION: Suspicion of internal hernia with small bowel incarceration. Good portion of the central small intestine is involved and appears dilated, thick-walled, with surrounding edema and fluid. No free air. Some free intraperitoneal fluid diffusely. General surgical consultation recommended. Electronically Signed   By: Nelson Chimes M.D.   On: 09/03/2020 13:59    Procedures Procedures   Medications Ordered in ED Medications  sodium chloride (PF) 0.9 % injection (has no administration in time range)  piperacillin-tazobactam (ZOSYN) IVPB 3.375 g (has no administration in time range)  phenylephrine (NEOSYNEPHRINE) 10-0.9 MG/250ML-% infusion (has no administration in time range)  sodium chloride 0.9 % bolus 1,000 mL (0 mLs Intravenous Stopped 09/03/20 1401)  ondansetron (ZOFRAN) injection 4 mg (4 mg Intravenous Given 09/03/20 1156)  morphine 4 MG/ML injection 4 mg (4 mg Intravenous  Given 09/03/20 1157)  iohexol (OMNIPAQUE) 350 MG/ML injection 100 mL (  80 mLs Intravenous Contrast Given 09/03/20 1319)  morphine 4 MG/ML injection 4 mg (4 mg Intravenous Given 09/03/20 1524)   ED Course  I have reviewed the triage vital signs and the nursing notes.  Pertinent labs & imaging results that were available during my care of the patient were reviewed by me and considered in my medical decision making (see chart for details).  Here for evaluation nausea, vomiting and abdominal pain.  Began at 4 AM this morning.  She is afebrile, nonseptic appearing.  She does appear uncomfortable in the room with diffuse tenderness to her abdomen with some voluntary guarding.  Admits to some chronic constipation, unsure last bowel movement.  She is unsure if she is passing flatus.  No chest pain, shortness of breath or back pain.  Low suspicion for AAA or dissection.  Neurovascularly intact.  Plan on labs, imaging and reassess  Labs and imaging personally reviewed and interpreted:  CBC leukocytosis at 11.4 CMP glucose 208, creatinine 1.09 Lipase 23 UA for infection CT AP suspicious for internal hernia with bowel obstruction EKG without ischemic changes  Patient reassessed.  Comfortable in room.  Still awaiting labs and imaging.  Patient reassessed.  Discussed labs and imaging.  CT concerning for internal hernia with obstruction.  COVID test ordered.  We will plan on consult with general surgery.  CONSULT with Legrand Como, APP with general surgery who will evaluate patient for admission  Patient seen evaluated by attending, Dr. Tamera Punt who agrees with above treatment, plan and disposition.  The patient appears reasonably stabilized for admission considering the current resources, flow, and capabilities available in the ED at this time, and I doubt any other [facility] requiring further screening and/or treatment in the ED prior to admission.     MDM Rules/Calculators/A&P                           Final  Clinical Impression(s) / ED Diagnoses Final diagnoses:  Internal hernia  Complete intestinal obstruction, unspecified cause ([place])  Hyperglycemia    Rx / DC Orders ED Discharge Orders     None        Winnell Bento A, PA-C 09/03/20 1530    Malvin Johns, MD 09/04/20 [phone]

## 2020-09-03 NOTE — Progress Notes (Signed)
Pharmacy Antibiotic Note  Tammy Page is a 87 y.o. female admitted on 09/03/2020 with  intra-abdominal infection .  Pharmacy has been consulted for Zosyn dosing.  Plan: Zosyn 3.375g IV q8h (4 hour infusion). No dose adjustments anticipated.  Pharmacy to sign off.  Will monitor peripherally via electronic surveillance program.   Height: 5' 3.5" (161.3 cm) Weight: 72.6 kg (160 lb) IBW/kg (Calculated) : 53.55  Temp (24hrs), Avg:98.7 F (37.1 C), Min:98.7 F (37.1 C), Max:98.7 F (37.1 C)  Recent Labs  Lab 09/03/20 1215  WBC 11.4*  CREATININE 1.09*    Estimated Creatinine Clearance: 35.8 mL/min (A) (by C-G formula based on SCr of 1.09 mg/dL (H)).    No Known Allergies  Thank you for allowing pharmacy to be a part of this patient's care.  Netta Cedars PharmD 09/03/2020 3:16 PM

## 2020-09-03 NOTE — ED Notes (Addendum)
Patient placed on purewick by tech and made aware we need UA.   Patient unsure if she takes BP medication and unsure if she took it today.

## 2020-09-03 NOTE — Anesthesia Procedure Notes (Signed)
Procedure Name: Intubation Date/Time: 09/03/2020 4:11 PM Performed by: Lollie Sails, CRNA Pre-anesthesia Checklist: Patient identified, Emergency Drugs available, Suction available, Patient being monitored and Timeout performed Patient Re-evaluated:Patient Re-evaluated prior to induction Oxygen Delivery Method: Circle system utilized Preoxygenation: Pre-oxygenation with 100% oxygen Induction Type: IV induction, Rapid sequence and Cricoid Pressure applied Laryngoscope Size: Miller and 3 Grade View: Grade I Tube type: Oral Tube size: 7.5 mm Number of attempts: 1 Airway Equipment and Method: Stylet Placement Confirmation: ETT inserted through vocal cords under direct vision, positive ETCO2 and breath sounds checked- equal and bilateral Secured at: 22 cm Tube secured with: Tape Dental Injury: Teeth and Oropharynx as per pre-operative assessment

## 2020-09-04 ENCOUNTER — Encounter (HOSPITAL_COMMUNITY): Payer: Self-pay | Admitting: General Surgery

## 2020-09-04 LAB — CBC
HCT: 35 % — ABNORMAL LOW (ref 36.0–46.0)
Hemoglobin: 10.9 g/dL — ABNORMAL LOW (ref 12.0–15.0)
MCH: 29.7 pg (ref 26.0–34.0)
MCHC: 31.1 g/dL (ref 30.0–36.0)
MCV: 95.4 fL (ref 80.0–100.0)
Platelets: 144 10*3/uL — ABNORMAL LOW (ref 150–400)
RBC: 3.67 MIL/uL — ABNORMAL LOW (ref 3.87–5.11)
RDW: 13.3 % (ref 11.5–15.5)
WBC: 9.9 10*3/uL (ref 4.0–10.5)
nRBC: 0 % (ref 0.0–0.2)

## 2020-09-04 LAB — BASIC METABOLIC PANEL
Anion gap: 7 (ref 5–15)
BUN: 16 mg/dL (ref 8–23)
CO2: 23 mmol/L (ref 22–32)
Calcium: 8.4 mg/dL — ABNORMAL LOW (ref 8.9–10.3)
Chloride: 108 mmol/L (ref 98–111)
Creatinine, Ser: 1.1 mg/dL — ABNORMAL HIGH (ref 0.44–1.00)
GFR, Estimated: 49 mL/min — ABNORMAL LOW (ref >60)
Glucose, Bld: 151 mg/dL — ABNORMAL HIGH (ref 70–99)
Potassium: 4.9 mmol/L (ref 3.5–5.1)
Sodium: 138 mmol/L (ref 135–145)

## 2020-09-04 MED ORDER — SODIUM CHLORIDE 0.9 % IV SOLN
INTRAVENOUS | Status: DC | PRN
  Administered 2020-09-04: 250 mL via INTRAVENOUS

## 2020-09-04 MED ORDER — LEVOTHYROXINE SODIUM 100 MCG/5ML IV SOLN
50.0000 ug | Freq: Every day | INTRAVENOUS | Status: DC
Start: 2020-09-07 — End: 2020-09-06

## 2020-09-04 NOTE — Evaluation (Addendum)
Physical Therapy Evaluation Patient Details Name: Tammy Page MRN: [id number] DOB: [birth year] Today's Date: 09/04/2020   History of Present Illness  Tammy Page is a 87 y.o. female with a hx of HTN, HLD, Hyperthyroidism who presented to the ED7/18/22 with abdominal pain, n/v. Found with SBO, S/P exploratory lap,SMALL BOWEL RESECTION ,  LYSIS OF ADHESION  Clinical Impression  The patient is  resting in bed, family present, conversing. Patient required min assistance and  verbal cues for mobility and able to stand and pivot to recliner With  1 hand hold. Did not ambulate due to NG tube  present and hooked to suction.  Patient should progress to return home with family assisting PRN. Patient independent PTA . Pt admitted with above diagnosis.  Pt currently with functional limitations due to the deficits listed below (see PT Problem List). Pt will benefit from skilled PT to increase their independence and safety with mobility to allow discharge to the venue listed below.       Follow Up Recommendations No PT follow up    Equipment Recommendations  None recommended by PT    Recommendations for Other Services    OT   Precautions / Restrictions Precautions Precaution Comments: NG suction Restrictions Weight Bearing Restrictions: No      Mobility  Bed Mobility Overal bed mobility: Needs Assistance Bed Mobility: Rolling;Sidelying to Sit Rolling: Min guard Sidelying to sit: Min guard       General bed mobility comments: cues for abdomen  precautions    Transfers Overall transfer level: Needs assistance Equipment used: 1 person hand held assist Transfers: Sit to/from [facility] Sit to Stand: Min assist Stand pivot transfers: Min assist       General transfer comment: hand  hold assist, patient holding NG tube  Ambulation/Gait             General Gait Details: to recliner from bed  Stairs            Wheelchair Mobility    Modified  [place]in (Stroke Patients Only)       Balance Overall balance assessment: Needs assistance Sitting-balance support: Feet unsupported;No upper extremity supported Sitting balance-Leahy Scale: Good     Standing balance support: During functional activity;Single extremity supported Standing balance-Leahy Scale: Fair                               Pertinent Vitals/Pain Pain Assessment: 0-10 Pain Score: 5  Pain Location: abdomen when moving and laughing. Pain Descriptors / Indicators: Discomfort Pain Intervention(s): Premedicated before session;Monitored during session    [place] expects to be discharged to:: Private residence Living Arrangements: Alone Available Help at Discharge: Available PRN/intermittently;Family Type of Home: House Home Access: Level entry     Home Layout: One level Home Equipment: None      Prior Function Level of Independence: Independent               Hand Dominance   Dominant Hand: Right    Extremity/Trunk Assessment   Upper Extremity Assessment Upper Extremity Assessment: Overall WFL for tasks assessed    Lower Extremity Assessment Lower Extremity Assessment: Generalized weakness    Cervical / Trunk Assessment Cervical / Trunk Assessment: Other exceptions Cervical / Trunk Exceptions: guarded abdomen  Communication   Communication: No difficulties  Cognition Arousal/Alertness: Awake/alert Behavior During Therapy: WFL for tasks assessed/performed Overall Cognitive Status: Within Functional Limits for tasks assessed  General Comments      Exercises     Assessment/Plan    PT Assessment Patient needs continued PT services  PT Problem List Decreased strength;Decreased mobility;Decreased safety awareness;Decreased activity tolerance;Pain;Decreased knowledge of precautions       PT Treatment Interventions DME instruction;Therapeutic  activities;Gait training;Patient/family education;Therapeutic exercise;Functional mobility training    PT Goals (Current goals can be found in the Care Plan section)  Acute Rehab PT Goals Patient Stated Goal: go home PT Goal Formulation: With patient Time For Goal Achievement: 09/18/20 Potential to Achieve Goals: Good    Frequency Min 3X/week   Barriers to discharge        Co-evaluation               AM-PAC PT "6 Clicks" Mobility  Outcome Measure Help needed turning from your back to your side while in a flat bed without using bedrails?: A Little Help needed moving from lying on your back to sitting on the side of a flat bed without using bedrails?: A Little Help needed moving to and from a bed to a chair (including a wheelchair)?: A Little Help needed standing up from a chair using your arms (e.g., wheelchair or bedside chair)?: A Little Help needed to walk in hospital room?: A Little Help needed climbing 3-5 steps with a railing? : A Lot 6 Click Score: 17    End of Session   Activity Tolerance: Patient tolerated treatment well Patient left: with call bell/phone within reach;in chair;with family/visitor present Nurse Communication: Mobility status PT Visit Diagnosis: Unsteadiness on feet (R26.81);Difficulty in walking, not elsewhere classified (R26.2);Pain    Time: 4825-0037 PT Time Calculation (min) (ACUTE ONLY): 25 min   Charges:   PT Evaluation $PT Eval Low Complexity: 1 Low PT Treatments $Therapeutic Activity: 8-22 mins        Tresa Endo PT Acute Rehabilitation Services Pager [phone] Office [phone]   Claretha Cooper 09/04/2020, 10:09 AM

## 2020-09-04 NOTE — Progress Notes (Signed)
1 Day Post-Op   Subjective/Chief Complaint: No complaints. Feels much better than yesterday   Objective: Vital signs in last 24 hours: Temp:  [98.2 F (36.8 C)-98.7 F (37.1 C)] 98.2 F (36.8 C) (07/19 0000) Pulse Rate:  [63-89] 73 (07/19 0600) Resp:  [10-27] 26 (07/19 0600) BP: (119-186)/(59-102) 146/60 (07/19 0600) SpO2:  [96 %-100 %] 100 % (07/19 0717) Weight:  [71.9 kg-72.6 kg] 71.9 kg (07/18 1833) Last BM Date: 09/02/20  Intake/Output from previous day: 07/18 0701 - 07/19 0700 In: 4931 [I.V.:3457.4; IV Piggyback:1473.6] Out: 1450 [Urine:700; Blood:750] Intake/Output this shift: No intake/output data recorded.  General appearance: alert and cooperative Resp: clear to auscultation bilaterally Cardio: regular rate and rhythm GI: soft, mild tenderness. Incision looks good  Lab Results:  Recent Labs    09/03/20 1215 09/04/20 0241  WBC 11.4* 9.9  HGB 13.6 10.9*  HCT 42.5 35.0*  PLT 187 144*   BMET Recent Labs    09/03/20 1215 09/04/20 0241  NA 141 138  K 3.6 4.9  CL 105 108  CO2 25 23  GLUCOSE 208* 151*  BUN 15 16  CREATININE 1.09* 1.10*  CALCIUM 9.9 8.4*   PT/INR No results for input(s): LABPROT, INR in the last 72 hours. ABG No results for input(s): PHART, HCO3 in the last 72 hours.  Invalid input(s): PCO2, PO2  Studies/Results: CT Abdomen Pelvis W Contrast  Result Date: 09/03/2020 CLINICAL DATA:  Mid abdominal pain and bloating. Nausea and vomiting. EXAM: CT ABDOMEN AND PELVIS WITH CONTRAST TECHNIQUE: Multidetector CT imaging of the abdomen and pelvis was performed using the standard protocol following bolus administration of intravenous contrast. CONTRAST:  31mL OMNIPAQUE IOHEXOL 350 MG/ML SOLN COMPARISON:  None. FINDINGS: Lower chest: Minimal scarring at the lung bases. Hepatobiliary: Liver parenchyma is normal.  No calcified gallstones. Pancreas: Normal Spleen: Normal Adrenals/Urinary Tract: Adrenal glands are normal. Kidneys are normal. No cyst,  mass, stone or hydronephrosis. Bladder is normal. Stomach/Bowel: Stomach and proximal small intestine are normal. There is a large grouping of abnormal small bowel loops in the central to left side of the abdomen which are dilated, thick-walled and surrounded by edema and fluid. Findings are worrisome for incarcerated internal hernia. More distal small intestine is normal. Colon is normal. Vascular/Lymphatic: Aortic atherosclerosis. No aneurysm. IVC is normal. No retroperitoneal adenopathy. Reproductive: Previous hysterectomy.  No pelvic mass. Other: Free intraperitoneal fluid.  No free air. Musculoskeletal: Curvature and chronic degenerative changes of the spine. IMPRESSION: Suspicion of internal hernia with small bowel incarceration. Good portion of the central small intestine is involved and appears dilated, thick-walled, with surrounding edema and fluid. No free air. Some free intraperitoneal fluid diffusely. General surgical consultation recommended. Electronically Signed   By: Nelson Chimes M.D.   On: 09/03/2020 13:59    Anti-infectives: Anti-infectives (From admission, onward)    Start     Dose/Rate Route Frequency Ordered Stop   09/04/20 0000  piperacillin-tazobactam (ZOSYN) IVPB 3.375 g        3.375 g 12.5 mL/hr over 240 Minutes Intravenous Every 8 hours 09/03/20 1848     09/03/20 1600  piperacillin-tazobactam (ZOSYN) IVPB 3.375 g  Status:  Discontinued        3.375 g 12.5 mL/hr over 240 Minutes Intravenous Every 8 hours 09/03/20 1504 09/03/20 1831       Assessment/Plan: s/p Procedure(s): EXPLORATORY LAPAROTOMY (N/A) SMALL BOWEL RESECTION (N/A) LYSIS OF ADHESION (N/A) Continue ng and bowel rest Hg stable Lovenox for vte prophylaxis OOB Transfer to floor Pod 1  LOS: 1 day    Autumn Messing III 09/04/2020

## 2020-09-05 LAB — BASIC METABOLIC PANEL
Anion gap: 11 (ref 5–15)
BUN: 17 mg/dL (ref 8–23)
CO2: 24 mmol/L (ref 22–32)
Calcium: 8.8 mg/dL — ABNORMAL LOW (ref 8.9–10.3)
Chloride: 109 mmol/L (ref 98–111)
Creatinine, Ser: 1.2 mg/dL — ABNORMAL HIGH (ref 0.44–1.00)
GFR, Estimated: 44 mL/min — ABNORMAL LOW (ref >60)
Glucose, Bld: 81 mg/dL (ref 70–99)
Potassium: 3.8 mmol/L (ref 3.5–5.1)
Sodium: 144 mmol/L (ref 135–145)

## 2020-09-05 LAB — MAGNESIUM: Magnesium: 1.9 mg/dL (ref 1.7–2.4)

## 2020-09-05 LAB — SURGICAL PATHOLOGY

## 2020-09-05 MED ORDER — ACETAMINOPHEN 10 MG/ML IV SOLN
1000.0000 mg | Freq: Four times a day (QID) | INTRAVENOUS | Status: AC
  Administered 2020-09-05 – 2020-09-06 (×4): 1000 mg via INTRAVENOUS
  Filled 2020-09-05 (×4): qty 100

## 2020-09-05 NOTE — Progress Notes (Signed)
Mobility Specialist - Progress Note    09/05/20 1209  Mobility  Activity Ambulated in hall  Level of Assistance Contact guard assist, steadying assist  [place]ster wheel walker  Distance Ambulated (ft) 900 ft  Mobility Ambulated with assistance in hallway  Mobility Response Tolerated well  Mobility performed by Mobility specialist  $Mobility charge 1 Mobility     Pt ambulated ~900 ft in hallway using RW and contact assist from mobility specialist. Pt tolerated session very well and did not c/o of pain, dizziness, or SOB. Pt returned to recliner after ambulation and was left with call bell at side and NGT disconnected. RN informed about NGT.   [place] Specialist Acute Rehabilitation Services Phone: [phone] 09/05/20, 12:52 PM

## 2020-09-05 NOTE — Progress Notes (Signed)
2 Days Post-Op    CC: Abdominal pain  Subjective: Patient sitting up in OT is getting ready to walk her.  She has had no flatus so far.  Otherwise she seems to be feeling much better.  Objective: Vital signs in last 24 hours: Temp:  [97.9 F (36.6 C)-99.5 F (37.5 C)] 99.5 F (37.5 C) (07/20 0504) Pulse Rate:  [61-91] 91 (07/20 0504) Resp:  [12-20] 18 (07/20 0504) BP: (128-145)/(51-74) 134/64 (07/20 0504) SpO2:  [97 %-100 %] 98 % (07/20 0504) Last BM Date: 09/02/20  2361 IV 850 urine NG 400 No BM recorded T-max 99.5, other vital signs are stable Sodium 138, potassium 4.9, glucose 151, creatinine 1.1 (7/19)   Intake/Output from previous day: 07/19 0701 - 07/20 0700 In: 2361.4 [I.V.:2024.4; IV Piggyback:337.1] Out: 1250 [Urine:850; Emesis/NG output:400] Intake/Output this shift: No intake/output data recorded.  General appearance: alert, cooperative, and no distress Resp: Breath sounds diminished in the bases, no I-S in the room. GI: Soft, nondistended no bowel sounds, midline incision is clean with waffle dressing in place.  Lab Results:  Recent Labs    09/03/20 1215 09/04/20 0241  WBC 11.4* 9.9  HGB 13.6 10.9*  HCT 42.5 35.0*  PLT 187 144*    BMET Recent Labs    09/03/20 1215 09/04/20 0241  NA 141 138  K 3.6 4.9  CL 105 108  CO2 25 23  GLUCOSE 208* 151*  BUN 15 16  CREATININE 1.09* 1.10*  CALCIUM 9.9 8.4*   PT/INR No results for input(s): LABPROT, INR in the last 72 hours.  Recent Labs  Lab 09/03/20 1215  AST 22  ALT 21  ALKPHOS 58  BILITOT 0.7  PROT 7.0  ALBUMIN 4.1     Lipase     Component Value Date/Time   LIPASE 23 09/03/2020 1215     Medications:  brinzolamide  1 drop Both Eyes TID   Chlorhexidine Gluconate Cloth  6 each Topical Daily   cycloSPORINE  1 drop Both Eyes BID   enoxaparin (LOVENOX) injection  40 mg Subcutaneous Q24H   latanoprost  1 drop Both Eyes QHS   [START ON 09/07/2020] levothyroxine  50 mcg Intravenous  Daily   mouth rinse  15 mL Mouth Rinse BID    sodium chloride 100 mL/hr at 09/04/20 2323   sodium chloride Stopped (09/04/20 1501)   methocarbamol (ROBAXIN) IV     piperacillin-tazobactam (ZOSYN)  IV 3.375 g (09/05/20 0845)     Assessment/Plan  Small bowel obstruction Exploratory laparotomy, small bowel resection, lysis of adhesions, 09/03/2020 Dr. Autumn Messing III POD #2  -Plan: Continue NG decompression, sips and chips, flush the NG, retake the NG.  Incentive spirometry  FEN:  NPO/IV fluids ID: Zosyn 7/18>> day 3 DVT: Lovenox  Hypertension Hyperlipidemia Hyperthyroidism       LOS: 2 days    Tammy Page 09/05/2020 Please see Amion

## 2020-09-05 NOTE — Evaluation (Signed)
Occupational Therapy Evaluation Patient Details Name: Tammy Page MRN: [id number] DOB: [birth year] Today's Date: 09/05/2020    History of Present Illness Tammy Page is a 87 y.o. female with a hx of HTN, HLD, Hyperthyroidism who presented to the ED7/18/22 with abdominal pain, n/v. Found with SBO, S/P exploratory lap,SMALL BOWEL RESECTION ,  LYSIS OF ADHESION   Clinical Impression   Tammy Page is an 87 year old woman s/p small bowel resection who presents with generalized weakness and decreased activity tolerance. On evaluation patient min guard for mobility and ADLs. Patient limited by NG tube. Patient will benefit from skilled OT services while in hospital to improve deficits and learn compensatory strategies as needed in order to return to PLOF.  Expect no OT needs at discharge.     Follow Up Recommendations  No OT follow up    Equipment Recommendations  None recommended by OT    Recommendations for Other Services       Precautions / Restrictions Precautions Precautions: Other (comment) Precaution Comments: NG suction, abdominal incision Restrictions Weight Bearing Restrictions: No      Mobility Bed Mobility Overal bed mobility: Needs Assistance Bed Mobility: Supine to Sit;Sit to Supine Rolling: Min guard Sidelying to sit: Min guard Supine to sit: Min guard Sit to supine: Min guard   General bed mobility comments: cues for log roll    Transfers Overall transfer level: Needs assistance Equipment used: None Transfers: Sit to/from [facility] Sit to Stand: Min guard Stand pivot transfers: Min guard       General transfer comment: min guard for short ambulation in room. Limited by NG    Balance Overall balance assessment: No apparent balance deficits (not formally assessed)                                         ADL either performed or assessed with clinical judgement   ADL Overall ADL's : Needs  assistance/impaired Eating/Feeding: NPO   Grooming: Standing   Upper Body Bathing: Set up;Sitting   Lower Body Bathing: Set up;Sit to/from stand   Upper Body Dressing : Set up;Sitting   Lower Body Dressing: Min guard;Sit to/from stand   Toilet Transfer: Min guard;BSC   Toileting- Water quality scientist and Hygiene: Sit to/from stand;Minimal assistance       Functional mobility during ADLs: Min guard       Vision Patient Visual Report: No change from baseline       Perception     Praxis      Pertinent Vitals/Pain Pain Assessment: No/denies pain     Hand Dominance Right   Extremity/Trunk Assessment Upper Extremity Assessment Upper Extremity Assessment: Overall WFL for tasks assessed   Lower Extremity Assessment Lower Extremity Assessment: Defer to PT evaluation   Cervical / Trunk Assessment Cervical / Trunk Assessment: Normal   Communication Communication Communication: No difficulties   Cognition Arousal/Alertness: Awake/alert Behavior During Therapy: WFL for tasks assessed/performed Overall Cognitive Status: Within Functional Limits for tasks assessed                                     General Comments       Exercises     Shoulder Instructions      Home Living Family/patient expects to be discharged to:: Private residence Living Arrangements: Alone  Available Help at Discharge: Available PRN/intermittently;Family Type of Home: House Home Access: Level entry     Home Layout: One level     Bathroom Shower/Tub: Teacher, early years/pre: Standard     Home Equipment: None          Prior Functioning/Environment Level of Independence: Independent                 OT Problem List: Decreased activity tolerance;Pain;Decreased knowledge of use of DME or AE;Decreased knowledge of precautions      OT Treatment/Interventions: Self-care/ADL training;DME and/or AE instruction;Therapeutic activities;Patient/family  education    OT Goals(Current goals can be found in the care plan section) Acute Rehab OT Goals Patient Stated Goal: return to independence OT Goal Formulation: With patient Time For Goal Achievement: 09/19/20 Potential to Achieve Goals: Good  OT Frequency: Min 2X/week   Barriers to D/C:            Co-evaluation              AM-PAC OT "6 Clicks" Daily Activity     Outcome Measure Help from another person eating meals?: Total (npo) Help from another person taking care of personal grooming?: A Little Help from another person toileting, which includes using toliet, bedpan, or urinal?: A Little Help from another person bathing (including washing, rinsing, drying)?: A Little Help from another person to put on and taking off regular upper body clothing?: A Little Help from another person to put on and taking off regular lower body clothing?: A Little 6 Click Score: 16   End of Session Nurse Communication: Mobility status  Activity Tolerance: Patient tolerated treatment well Patient left: in chair;with call bell/phone within reach;with family/visitor present  OT Visit Diagnosis: Muscle weakness (generalized) (M62.81)                Time: 8242-3536 OT Time Calculation (min): 24 min Charges:  OT General Charges $OT Visit: 1 Visit OT Evaluation $OT Eval Low Complexity: 1 Low  Gerrica Cygan, OTR/L [place]  Office [phone] Pager: [place] 09/05/2020, 12:11 PM

## 2020-09-05 NOTE — Progress Notes (Signed)
Physical Therapy Treatment Patient Details Name: Tammy Page MRN: [id number] DOB: [birth year] Today's Date: 09/05/2020    History of Present Illness Tammy Page is a 87 y.o. female with a hx of HTN, HLD, Hyperthyroidism who presented to the ED7/18/22 with abdominal pain, n/v. Found with SBO, S/P exploratory lap,SMALL BOWEL RESECTION ,  LYSIS OF ADHESION    PT Comments    Pt tolerated good distance of ambulation.  Pt's NG tube mildly leaking from connecting/disconnecting site so RN notified.  Anticipate pt will progress well.   Follow Up Recommendations  No PT follow up     Equipment Recommendations  None recommended by PT    Recommendations for Other Services       Precautions / Restrictions Precautions Precautions: Other (comment) Precaution Comments: NG suction, abdominal incision Restrictions Weight Bearing Restrictions: No    Mobility  Bed Mobility Overal bed mobility: Needs Assistance Bed Mobility: Rolling;Sidelying to Sit;Sit to Sidelying Rolling: Supervision Sidelying to sit: Supervision Supine to sit: Min guard Sit to supine: Min guard Sit to sidelying: Supervision General bed mobility comments: cues for log roll    Transfers Overall transfer level: Needs assistance Equipment used: None Transfers: Sit to/from Stand Sit to Stand: Min guard Stand pivot transfers: Min guard       General transfer comment: min guard for short ambulation in room. Limited by NG  Ambulation/Gait Ambulation/Gait assistance: Min guard Gait Distance (Feet): 600 Feet Assistive device: Rolling walker (2 wheeled) Gait Pattern/deviations: Step-through pattern;Decreased stride length     General Gait Details: verbal cues for posture and distance from [employer]             Wheelchair Mobility    Modified Rankin (Stroke Patients Only)       Balance Overall balance assessment: No apparent balance deficits (not formally assessed)                                           Cognition Arousal/Alertness: Awake/alert Behavior During Therapy: WFL for tasks assessed/performed Overall Cognitive Status: Within Functional Limits for tasks assessed                                        Exercises      General Comments        Pertinent Vitals/Pain Pain Assessment: No/denies pain    Home Living Family/patient expects to be discharged to:: Private residence Living Arrangements: Alone Available Help at Discharge: Available PRN/intermittently;Family Type of Home: House Home Access: Level entry   Home Layout: One level Home Equipment: None      Prior Function Level of Independence: Independent          PT Goals (current goals can now be found in the care plan section) Acute Rehab PT Goals Patient Stated Goal: return to independence Progress towards PT goals: Progressing toward goals    Frequency    Min 3X/week      PT Plan Current plan remains appropriate    Co-evaluation              AM-PAC PT "6 Clicks" Mobility   Outcome Measure  Help needed turning from your back to your side while in a flat bed without using bedrails?: A Little Help needed moving from lying on your back  to sitting on the side of a flat bed without using bedrails?: A Little Help needed moving to and from a bed to a chair (including a wheelchair)?: A Little Help needed standing up from a chair using your arms (e.g., wheelchair or bedside chair)?: A Little Help needed to walk in hospital room?: A Little Help needed climbing 3-5 steps with a railing? : A Little 6 Click Score: 18    End of Session Equipment Utilized During Treatment: Gait belt Activity Tolerance: Patient tolerated treatment well Patient left: with call bell/phone within reach;in bed Nurse Communication: Mobility status PT Visit Diagnosis: Difficulty in walking, not elsewhere classified (R26.2);Muscle weakness (generalized) (M62.81)     Time:  1855-0158 PT Time Calculation (min) (ACUTE ONLY): 21 min  Charges:  $Gait Training: 8-22 mins                     Jannette Spanner PT, DPT Acute Rehabilitation Services Pager: [phone] Office: [phone]    [place] Ram E 09/05/2020, 3:19 PM

## 2020-09-06 LAB — OCCULT BLOOD X 1 CARD TO LAB, STOOL: Fecal Occult Bld: POSITIVE — AB

## 2020-09-06 MED ORDER — LEVOTHYROXINE SODIUM 100 MCG PO TABS
100.0000 ug | ORAL_TABLET | Freq: Every day | ORAL | Status: DC
  Administered 2020-09-07 – 2020-09-09 (×3): 100 ug via ORAL
  Filled 2020-09-06 (×3): qty 1

## 2020-09-06 NOTE — Progress Notes (Signed)
Mobility Specialist - Progress Note    09/06/20 1623  Mobility  Activity Ambulated in hall  Level of Assistance Contact guard assist, steadying assist  Assistive Device Front wheel walker  Distance Ambulated (ft) 900 ft  Mobility Ambulated with assistance in hallway  Mobility Response Tolerated well  Mobility performed by Mobility specialist  $Mobility charge 1 Mobility    Pt required contact guard when getting out of bed. Pt ambulated ~900 ft in hallway using RW. Pt did not c/o of pain, dizziness, or SOB and tolerated session very well. Pt returned to bed with call bell at side and family in room.   [place] Specialist Acute Rehabilitation Services Phone: [phone] 09/06/20, 4:25 PM

## 2020-09-06 NOTE — Progress Notes (Signed)
3 Days Post-Op    CC: Abdominal pain  Subjective:  Patient is doing much better this AM.  She says she has had some bowel movements.  No output from the NG.  She did do some walking yesterday.  She still does not have an incentive spirometer. Objective: Vital signs in last 24 hours: Temp:  [97.9 F (36.6 C)-98.6 F (37 C)] 98.6 F (37 C) (07/21 0442) Pulse Rate:  [78-82] 82 (07/21 0442) Resp:  [18-20] 19 (07/21 0442) BP: (125-146)/(66-82) 125/66 (07/21 0442) SpO2:  [99 %-100 %] 99 % (07/21 0442) Last BM Date: 09/06/20 Nothing p.o. recorded 2884 IV recorded Voided x5 NG 150 recorded BM x4 recorded. Afebrile vital signs are stable. Creatinine 1.09>> 1.10>> 1.20 Intake/Output from previous day: 07/20 0701 - 07/21 0700 In: 2884.6 [I.V.:2442.7; IV Piggyback:441.9] Out: 150 [Emesis/NG output:150] Intake/Output this shift: No intake/output data recorded.  General appearance: alert, cooperative, and no distress Resp: clear to auscultation bilaterally GI: Positive bowel sounds, positive BM dressing clean and dry.  Lab Results:  Recent Labs    09/03/20 1215 09/04/20 0241  WBC 11.4* 9.9  HGB 13.6 10.9*  HCT 42.5 35.0*  PLT 187 144*    BMET Recent Labs    09/04/20 0241 09/05/20 1136  NA 138 144  K 4.9 3.8  CL 108 109  CO2 23 24  GLUCOSE 151* 81  BUN 16 17  CREATININE 1.10* 1.20*  CALCIUM 8.4* 8.8*   PT/INR No results for input(s): LABPROT, INR in the last 72 hours.  Recent Labs  Lab 09/03/20 1215  AST 22  ALT 21  ALKPHOS 58  BILITOT 0.7  PROT 7.0  ALBUMIN 4.1     Lipase     Component Value Date/Time   LIPASE 23 09/03/2020 1215     Medications:  brinzolamide  1 drop Both Eyes TID   cycloSPORINE  1 drop Both Eyes BID   enoxaparin (LOVENOX) injection  40 mg Subcutaneous Q24H   latanoprost  1 drop Both Eyes QHS   [START ON 09/07/2020] levothyroxine  50 mcg Intravenous Daily   mouth rinse  15 mL Mouth Rinse BID    sodium chloride 100 mL/hr at  09/06/20 0416   sodium chloride 10 mL/hr at 09/05/20 1605   methocarbamol (ROBAXIN) IV     piperacillin-tazobactam (ZOSYN)  IV 3.375 g (09/06/20 0801)     Assessment/Plan Small bowel obstruction Exploratory laparotomy, small bowel resection, lysis of adhesions, 09/03/2020 Dr. Autumn Messing III POD #3  -Plan: DC the NG, clear liquids, continue to mobilize.  Requested incentive spirometry again.  Continue IV fluids and monitor creatinine.  FEN:  NPO/IV fluids ID: Zosyn 7/18>> day  DVT: Lovenox   CKD  - Creatinine 1.09>> 1.10>> 1.20 Hypertension Lisinopril/hydrochlorothiazide 10/12.5 at home Tylenol Hyperlipidemia Hyperthyroidism         LOS: 3 days    Tammy Page 09/06/2020 Please see Amion

## 2020-09-06 NOTE — Progress Notes (Signed)
Coming on shift NT reported dark red/ maroon color loose stools. Pt is on clear liquid diet. RN notified on call J. Olena Heckle NP. Plan to FOBT next BM.

## 2020-09-06 NOTE — Care Management Important Message (Signed)
Important Message  Patient Details IM Letter given to the Patient. Name: Tammy Page MRN: [id number] Date of Birth: Jun 01, 1932   Medicare Important Message Given:  Yes     Kerin Salen 09/06/2020, 10:55 AM

## 2020-09-07 LAB — CBC
HCT: 25.2 % — ABNORMAL LOW (ref 36.0–46.0)
Hemoglobin: 8 g/dL — ABNORMAL LOW (ref 12.0–15.0)
MCH: 30.7 pg (ref 26.0–34.0)
MCHC: 31.7 g/dL (ref 30.0–36.0)
MCV: 96.6 fL (ref 80.0–100.0)
Platelets: 167 10*3/uL (ref 150–400)
RBC: 2.61 MIL/uL — ABNORMAL LOW (ref 3.87–5.11)
RDW: 14.1 % (ref 11.5–15.5)
WBC: 7.2 10*3/uL (ref 4.0–10.5)
nRBC: 0 % (ref 0.0–0.2)

## 2020-09-07 LAB — COMPREHENSIVE METABOLIC PANEL
ALT: 17 U/L (ref 0–44)
AST: 26 U/L (ref 15–41)
Albumin: 2.4 g/dL — ABNORMAL LOW (ref 3.5–5.0)
Alkaline Phosphatase: 35 U/L — ABNORMAL LOW (ref 38–126)
Anion gap: 6 (ref 5–15)
BUN: 11 mg/dL (ref 8–23)
CO2: 21 mmol/L — ABNORMAL LOW (ref 22–32)
Calcium: 8.1 mg/dL — ABNORMAL LOW (ref 8.9–10.3)
Chloride: 114 mmol/L — ABNORMAL HIGH (ref 98–111)
Creatinine, Ser: 0.97 mg/dL (ref 0.44–1.00)
GFR, Estimated: 57 mL/min — ABNORMAL LOW (ref >60)
Glucose, Bld: 87 mg/dL (ref 70–99)
Potassium: 3.7 mmol/L (ref 3.5–5.1)
Sodium: 141 mmol/L (ref 135–145)
Total Bilirubin: 0.7 mg/dL (ref 0.3–1.2)
Total Protein: 4.5 g/dL — ABNORMAL LOW (ref 6.5–8.1)

## 2020-09-07 NOTE — Progress Notes (Signed)
4 Days Post-Op    CC: Abdominal pain  Subjective: Patient is tolerating clear liquids well, having bowel movements.  Tammy Page did see some blood in her stool yesterday.  Objective: Vital signs in last 24 hours: Temp:  [98.1 F (36.7 C)-98.7 F (37.1 C)] 98.6 F (37 C) (07/22 0622) Pulse Rate:  [78-84] 79 (07/22 0622) Resp:  [18-20] 20 (07/22 0622) BP: (125-148)/(58-76) 148/76 (07/22 0622) SpO2:  [96 %-99 %] 96 % (07/22 0622) Last BM Date: 09/06/20 236 p.o. recorded 1041 IV recorded Voided x5 BM x3 Afebrile, vital signs are stable CMP is stable, creatinine down to 0.97. H/H 10.9/35>> 8.0/25 Intake/Output from previous day: 07/21 0701 - 07/22 0700 In: 1277.9 [P.O.:236; I.V.:941.9; IV Piggyback:100] Out: -  Intake/Output this shift: No intake/output data recorded.  General appearance: alert, cooperative, no distress,  Chest:  still decreased at the bases with rales, Only moving 500 on IS and barely that.   Abdomen:  dressing is dry and intact, + BS, + BM tolerating clears Blood reported in the stool yesterday.  Lab Results:  Recent Labs    09/07/20 0559  WBC 7.2  HGB 8.0*  HCT 25.2*  PLT 167    BMET Recent Labs    09/05/20 1136 09/07/20 0456  NA 144 141  K 3.8 3.7  CL 109 114*  CO2 24 21*  GLUCOSE 81 87  BUN 17 11  CREATININE 1.20* 0.97  CALCIUM 8.8* 8.1*   PT/INR No results for input(s): LABPROT, INR in the last 72 hours.  Recent Labs  Lab 09/03/20 1215 09/07/20 0456  AST 22 26  ALT 21 17  ALKPHOS 58 35*  BILITOT 0.7 0.7  PROT 7.0 4.5*  ALBUMIN 4.1 2.4*     Lipase     Component Value Date/Time   LIPASE 23 09/03/2020 1215     Medications:  brinzolamide  1 drop Both Eyes TID   cycloSPORINE  1 drop Both Eyes BID   enoxaparin (LOVENOX) injection  40 mg Subcutaneous Q24H   latanoprost  1 drop Both Eyes QHS   levothyroxine  100 mcg Oral QAC breakfast   mouth rinse  15 mL Mouth Rinse BID    sodium chloride 100 mL/hr at 09/07/20 0003    sodium chloride 10 mL/hr at 09/05/20 1605   methocarbamol (ROBAXIN) IV     piperacillin-tazobactam (ZOSYN)  IV 3.375 g (09/07/20 0004)     Assessment/Plan Small bowel obstruction Exploratory laparotomy, small bowel resection, lysis of adhesions, 09/03/2020 Dr. Autumn Messing III POD #4  -Plan: full liquids, hopefully advance later today or in AM.  Repeat labs in AM; creatinine is better, but also watching H/H.  Decrease IV and I will stop the antibiotics today.  Tammy Page lives alone but has some support.  I told her to start looking for some support and planning on going home.  Perhaps over the weekend or early next week Tammy Page can go home.  I have not restarted her BP medicines because Tammy Page does not need it and her renal function was elevated, but improving.   FEN:  clears/IV fluids ID: Zosyn 7/18>> day 5  DVT: Lovenox Follow up:  Dr. Marlou Starks 2-3 weeks, staples 8/1 - appointment requested   CKD  - Creatinine 1.09>> 1.10>> 1.20>>0.97 Hypertension Lisinopril/hydrochlorothiazide 10/12.5 at home Tylenol Hyperlipidemia Hyperthyroidism       LOS: 4 days    Tammy Page 09/07/2020 Please see Amion

## 2020-09-07 NOTE — Progress Notes (Addendum)
Mobility Specialist - Progress Note   09/07/20 1100  Mobility  Activity Ambulated in hall  Level of Assistance Standby assist, set-up cues, supervision of patient - no hands on  Assistive Device Front wheel walker  Distance Ambulated (ft) 950 ft  Mobility Ambulated with assistance in hallway  Mobility Response Tolerated well  Mobility performed by Mobility specialist (10:04-10:24)  $Mobility charge 1 Mobility    Upon entry pt was eager to walk. Pt required no assist with log roll or sitting EOB and is determined to get stronger. Pt ambulated 950 ft in hallway using RW and did not report feeling of pain, dizziness, or SOB. Pt tolerated session well and returned to bed with call bell at side. Pt would like to ambulate again, will check back as schedule permits.   [place] Specialist Acute Rehabilitation Services Phone: [phone] 09/07/20, 11:22 AM

## 2020-09-08 LAB — BASIC METABOLIC PANEL
Anion gap: 4 — ABNORMAL LOW (ref 5–15)
BUN: 6 mg/dL — ABNORMAL LOW (ref 8–23)
CO2: 25 mmol/L (ref 22–32)
Calcium: 8.8 mg/dL — ABNORMAL LOW (ref 8.9–10.3)
Chloride: 113 mmol/L — ABNORMAL HIGH (ref 98–111)
Creatinine, Ser: 0.93 mg/dL (ref 0.44–1.00)
GFR, Estimated: 60 mL/min — ABNORMAL LOW (ref >60)
Glucose, Bld: 103 mg/dL — ABNORMAL HIGH (ref 70–99)
Potassium: 3.5 mmol/L (ref 3.5–5.1)
Sodium: 142 mmol/L (ref 135–145)

## 2020-09-08 LAB — CBC
HCT: 28.1 % — ABNORMAL LOW (ref 36.0–46.0)
Hemoglobin: 9.2 g/dL — ABNORMAL LOW (ref 12.0–15.0)
MCH: 30.8 pg (ref 26.0–34.0)
MCHC: 32.7 g/dL (ref 30.0–36.0)
MCV: 94 fL (ref 80.0–100.0)
Platelets: 219 10*3/uL (ref 150–400)
RBC: 2.99 MIL/uL — ABNORMAL LOW (ref 3.87–5.11)
RDW: 13.9 % (ref 11.5–15.5)
WBC: 7 10*3/uL (ref 4.0–10.5)
nRBC: 0 % (ref 0.0–0.2)

## 2020-09-08 MED ORDER — HYDROCODONE-ACETAMINOPHEN 5-325 MG PO TABS
1.0000 | ORAL_TABLET | Freq: Four times a day (QID) | ORAL | Status: DC | PRN
Start: 2020-09-08 — End: 2020-09-09
  Administered 2020-09-08: 1 via ORAL
  Filled 2020-09-08: qty 1

## 2020-09-08 NOTE — Progress Notes (Signed)
Physical Therapy Treatment and Discharge from Acute PT Patient Details Name: Tammy Page MRN: [id number] DOB: 12-17-1932 Today's Date: 09/08/2020    History of Present Illness Tammy Page is a 87 y.o. female with a hx of HTN, HLD, Hyperthyroidism who presented to the ED7/18/22 with abdominal pain, n/v. Found with SBO, S/P exploratory lap,SMALL BOWEL RESECTION ,  LYSIS OF ADHESION    PT Comments    Pt ambulated in hallway and currently supervision level.  Pt eager to mobilize again later today and encouraged to ask nursing staff to assist her.  Pt has met acute PT goals and agreeable to PT to sign off.  Pt hopeful to continue ambulating with mobility specialist and nursing staff.    Follow Up Recommendations  No PT follow up     Equipment Recommendations  None recommended by PT    Recommendations for Other Services       Precautions / Restrictions Precautions Precautions: Other (comment) Precaution Comments: abdominal incision    Mobility  Bed Mobility Overal bed mobility: Modified Independent                  Transfers Overall transfer level: Needs assistance Equipment used: None Transfers: Sit to/from [employer] Transfers Sit to Stand: Supervision Stand pivot transfers: Supervision          Ambulation/Gait Ambulation/Gait assistance: Supervision Gait Distance (Feet): 900 Feet Assistive device: Rolling walker (2 wheeled) Gait Pattern/deviations: Step-through pattern;Decreased stride length     General Gait Details: verbal cues for posture and distance from [employer]   Stairs             Wheelchair Mobility    Modified Rankin (Stroke Patients Only)       Balance                                            Cognition Arousal/Alertness: Awake/alert Behavior During Therapy: WFL for tasks assessed/performed Overall Cognitive Status: Within Functional Limits for tasks assessed                                         Exercises      General Comments        Pertinent Vitals/Pain Pain Assessment: No/denies pain    Home Living                      Prior Function            PT Goals (current goals can now be found in the care plan section) Progress towards PT goals: Goals met/education completed, patient discharged from PT    Frequency    Min 3X/week      PT Plan Current plan remains appropriate    Co-evaluation              AM-PAC PT "6 Clicks" Mobility   Outcome Measure  Help needed turning from your back to your side while in a flat bed without using bedrails?: None Help needed moving from lying on your back to sitting on the side of a flat bed without using bedrails?: None Help needed moving to and from a bed to a chair (including a wheelchair)?: None Help needed standing up from a chair using your arms (e.g., wheelchair or bedside  chair)?: A Little Help needed to walk in hospital room?: A Little Help needed climbing 3-5 steps with a railing? : A Little 6 Click Score: 21    End of Session   Activity Tolerance: Patient tolerated treatment well Patient left: in bed;with call bell/phone within reach Nurse Communication: Mobility status PT Visit Diagnosis: Difficulty in walking, not elsewhere classified (R26.2);Muscle weakness (generalized) (M62.81)     Time: 5885-0277 PT Time Calculation (min) (ACUTE ONLY): 20 min  Charges:  $Gait Training: 8-22 mins                    Jannette Spanner PT, DPT Acute Rehabilitation Services Pager: [phone] Office: [phone]    York Ram E 09/08/2020, 12:50 PM

## 2020-09-08 NOTE — Progress Notes (Signed)
5 Days Post-Op   Subjective/Chief Complaint: Comfortable Tolerating clears Up with a walker   Objective: Vital signs in last 24 hours: Temp:  [98.6 F (37 C)] 98.6 F (37 C) (07/23 [id number]) Pulse Rate:  [78-79] 79 (07/23 0637) Resp:  [20] 20 (07/23 0637) BP: (156-163)/(70-77) 156/77 (07/23 0637) SpO2:  [100 %] 100 % (07/23 0637) Last BM Date: 09/06/20  Intake/Output from previous day: 07/22 0701 - 07/23 0700 In: 240 [P.O.:240] Out: -  Intake/Output this shift: No intake/output data recorded.  Exam: Awake and alert Abdomen soft, dressing dry and intact  Lab Results:  Recent Labs    09/07/20 0559 09/08/20 0452  WBC 7.2 7.0  HGB 8.0* 9.2*  HCT 25.2* 28.1*  PLT 167 219   BMET Recent Labs    09/07/20 0456 09/08/20 0452  NA 141 142  K 3.7 3.5  CL 114* 113*  CO2 21* 25  GLUCOSE 87 103*  BUN 11 6*  CREATININE 0.97 0.93  CALCIUM 8.1* 8.8*   PT/INR No results for input(s): LABPROT, INR in the last 72 hours. ABG No results for input(s): PHART, HCO3 in the last 72 hours.  Invalid input(s): PCO2, PO2  Studies/Results: No results found.  Anti-infectives: Anti-infectives (From admission, onward)    Start     Dose/Rate Route Frequency Ordered Stop   09/04/20 0000  piperacillin-tazobactam (ZOSYN) IVPB 3.375 g        3.375 g 12.5 mL/hr over 240 Minutes Intravenous Every 8 hours 09/03/20 1848 09/07/20 2056   09/03/20 1600  piperacillin-tazobactam (ZOSYN) IVPB 3.375 g  Status:  Discontinued        3.375 g 12.5 mL/hr over 240 Minutes Intravenous Every 8 hours 09/03/20 1504 09/03/20 1831       Assessment/Plan: Small bowel obstruction Exploratory laparotomy, small bowel resection, lysis of adhesions, 09/03/2020 Dr. Autumn Messing III POD #5  LOS: 5 days    Advance to soft diet Ambulate Potential discharge in the next 24 to 48 hours  Tammy Page 09/08/2020

## 2020-09-09 MED ORDER — HYDROCODONE-ACETAMINOPHEN 5-325 MG PO TABS: 1.0000 | ORAL_TABLET | Freq: Four times a day (QID) | ORAL | 0 refills | Status: DC | PRN

## 2020-09-09 NOTE — Discharge Instructions (Signed)
[facility], [place] [phone]  OPEN ABDOMINAL SURGERY: POST OP INSTRUCTIONS  Always review your discharge instruction sheet given to you by the facility where your surgery was performed.  IF YOU HAVE DISABILITY OR FAMILY LEAVE FORMS, YOU MUST BRING THEM TO THE OFFICE FOR PROCESSING.  PLEASE DO NOT GIVE THEM TO YOUR DOCTOR.  A prescription for pain medication may be given to you upon discharge.  Take your pain medication as prescribed, if needed.  If narcotic pain medicine is not needed, then you may take acetaminophen (Tylenol) or ibuprofen (Advil) as needed. Take your usually prescribed medications unless otherwise directed. If you need a refill on your pain medication, please contact your pharmacy. They will contact our office to request authorization.  Prescriptions will not be filled after 5pm or on week-ends. You should follow a light diet the first few days after arrival home, such as soup and crackers, pudding, etc.unless your doctor has advised otherwise. A high-fiber, low fat diet can be resumed as tolerated.   Be sure to include lots of fluids daily. Most patients will experience some swelling and bruising on the chest and neck area.  Ice packs will help.  Swelling and bruising can take several days to resolve Most patients will experience some swelling and bruising in the area of the incision. Ice pack will help. Swelling and bruising can take several days to resolve..  It is common to experience some constipation if taking pain medication after surgery.  Increasing fluid intake and taking a stool softener will usually help or prevent this problem from occurring.  A mild laxative (Milk of Magnesia or Miralax) should be taken according to package directions if there are no bowel movements after 48 hours.  You may have steri-strips (small skin tapes) in place directly over the incision.  These strips should be left on the skin for 7-10 days.  If your surgeon used skin  glue on the incision, you may shower in 24 hours.  The glue will flake off over the next 2-3 weeks.  Any sutures or staples will be removed at the office during your follow-up visit. You may find that a light gauze bandage over your incision may keep your staples from being rubbed or pulled. You may shower and replace the bandage daily. ACTIVITIES:  You may resume regular (light) daily activities beginning the next day--such as daily self-care, walking, climbing stairs--gradually increasing activities as tolerated.  You may have sexual intercourse when it is comfortable.  Refrain from any heavy lifting or straining until approved by your doctor. You may drive when you no longer are taking prescription pain medication, you can comfortably wear a seatbelt, and you can safely maneuver your car and apply brakes Return to Work: ___________________________________ Dennis Bast should see your doctor in the office for a follow-up appointment approximately two weeks after your surgery.  Make sure that you call for this appointment within a day or two after you arrive home to insure a convenient appointment time. OTHER INSTRUCTIONS: ok to shower  Ice pack, tylenol, and ibuprofen for pain No lifting more than 15 pounds for 5 more weeks _____________________________________________________________ _____________________________________________________________  WHEN TO CALL YOUR DOCTOR: Fever over 101.0 Inability to urinate Nausea and/or vomiting Extreme swelling or bruising Continued bleeding from incision. Increased pain, redness, or drainage from the incision. Difficulty swallowing or breathing Muscle cramping or spasms. Numbness or tingling in hands or feet or around lips.  The clinic staff is available to  answer your questions during regular business hours.  Please don't hesitate to call and ask to speak to one of the nurses if you have concerns.  For further questions, please visit  [email or web address]

## 2020-09-09 NOTE — Progress Notes (Signed)
Patient ID: Tammy Page, female   DOB: [birth year], 87 y.o.   MRN: [id number]   Doing well Ready for discharge

## 2020-09-09 NOTE — Progress Notes (Signed)
Pt being d/c to home. Discharge instructions and medication education provided to pt.

## 2020-09-09 NOTE — Discharge Summary (Signed)
Physician Discharge Summary  Patient ID: Tammy Page MRN: [id number] DOB/AGE: 85-08-1932 85 y.o.  Admit date: 09/03/2020 Discharge date: 09/09/2020  Admission Diagnoses:  Discharge Diagnoses:  Active Problems:   Internal hernia   S/P exploratory laparotomy   Discharged Condition: good  Hospital Course: Patient was admitted with an internal hernia.  She underwent an exploratory laparotomy and bowel resection.  She had a uneventful postoperative recovery.  She had a quick return of bowel function.  She began eating well, moving her bowels, and was comfortable pain medication.  By 7/24, she was doing well with ambulation and her pain was well controlled.  The decision was made to discharge patient to home  Consults: None  Significant Diagnostic Studies:   Treatments: surgery: Exploratory laparotomy with small bowel resection  Discharge Exam: Blood pressure (!) 143/76, pulse 72, temperature 99 F (37.2 C), temperature source Oral, resp. rate 18, height '5\' 3"'$  (1.6 m), weight 71.9 kg, SpO2 98 %. General appearance: alert, cooperative, and no distress Resp: clear to auscultation bilaterally Cardio: regular rate and rhythm, S1, S2 normal, no murmur, click, rub or gallop Incision/Wound:abdomen soft, incision healing well  Disposition: Discharge disposition: 01-Home or Self Care        Allergies as of 09/09/2020   No Known Allergies      Medication List     TAKE these medications    aspirin 81 MG tablet Take 81 mg by mouth every morning.   brinzolamide 1 % ophthalmic suspension Commonly known as: AZOPT Place 1 drop into both eyes 2 (two) times daily.   diclofenac sodium 1 % Gel Commonly known as: VOLTAREN Apply 1 application topically 2 (two) times daily. For arthritis pain   Hair/Skin/Nails/Biotin Tabs Take 2 tablets by mouth every morning.   HYDROcodone-acetaminophen 5-325 MG tablet Commonly known as: NORCO/VICODIN Take 1 tablet by mouth every 6 (six) hours  as needed for moderate pain.   levothyroxine 100 MCG tablet Commonly known as: SYNTHROID Take 100 mcg by mouth daily before breakfast.   lisinopril-hydrochlorothiazide 10-12.5 MG tablet Commonly known as: ZESTORETIC Take 1 tablet by mouth every morning.   oxymetazoline 0.05 % nasal spray Commonly known as: AFRIN Place 1 spray into both nostrils 2 (two) times daily as needed for congestion (allergies).   Travoprost (BAK Free) 0.004 % Soln ophthalmic solution Commonly known as: TRAVATAN Place 1 drop into both eyes at bedtime.       ASK your doctor about these medications    levothyroxine 88 MCG tablet Commonly known as: SYNTHROID Take 88 mcg by mouth daily before breakfast.   Restasis 0.05 % ophthalmic emulsion Generic drug: cycloSPORINE Place 1 drop into both eyes 2 (two) times daily.        Follow-up [facility], [place]. Call.   Specialty: General Surgery Why: For suture removal.  need staples out next Friday 7/29 or Monday 8/1.  Surgeon:  Dr. Orson Gear information: [address] [place] [phone]                Signed: Coralie Keens 09/09/2020, 8:00 AM

## 2020-10-30 ENCOUNTER — Other Ambulatory Visit: Payer: Self-pay | Admitting: Obstetrics and Gynecology

## 2020-10-30 DIAGNOSIS — R928 Other abnormal and inconclusive findings on diagnostic imaging of breast: Secondary | ICD-10-CM

## 2020-11-09 ENCOUNTER — Ambulatory Visit
Admission: RE | Admit: 2020-11-09 | Discharge: 2020-11-09 | Disposition: A | Discharge status: Home / Self Care | Payer: Medicare HMO | Source: Ambulatory Visit | Attending: Obstetrics and Gynecology | Admitting: Obstetrics and Gynecology

## 2020-11-09 ENCOUNTER — Other Ambulatory Visit: Payer: Self-pay

## 2020-11-09 DIAGNOSIS — R928 Other abnormal and inconclusive findings on diagnostic imaging of breast: Secondary | ICD-10-CM

## 2022-06-23 ENCOUNTER — Emergency Department (HOSPITAL_BASED_OUTPATIENT_CLINIC_OR_DEPARTMENT_OTHER): Payer: Medicare HMO

## 2022-06-23 ENCOUNTER — Encounter (HOSPITAL_BASED_OUTPATIENT_CLINIC_OR_DEPARTMENT_OTHER): Payer: Self-pay | Admitting: Emergency Medicine

## 2022-06-23 ENCOUNTER — Inpatient Hospital Stay (HOSPITAL_BASED_OUTPATIENT_CLINIC_OR_DEPARTMENT_OTHER)
Admission: EM | Admit: 2022-06-23 | Discharge: 2022-06-25 | DRG: 389 | Disposition: A | Discharge status: Home Health | Payer: Medicare HMO | Attending: Internal Medicine | Admitting: Internal Medicine

## 2022-06-23 ENCOUNTER — Other Ambulatory Visit: Payer: Self-pay

## 2022-06-23 ENCOUNTER — Other Ambulatory Visit (HOSPITAL_BASED_OUTPATIENT_CLINIC_OR_DEPARTMENT_OTHER): Payer: Self-pay

## 2022-06-23 DIAGNOSIS — Z803 Family history of malignant neoplasm of breast: Secondary | ICD-10-CM | POA: Diagnosis not present

## 2022-06-23 DIAGNOSIS — Z79890 Hormone replacement therapy (postmenopausal): Secondary | ICD-10-CM

## 2022-06-23 DIAGNOSIS — H409 Unspecified glaucoma: Secondary | ICD-10-CM | POA: Diagnosis present

## 2022-06-23 DIAGNOSIS — Z9049 Acquired absence of other specified parts of digestive tract: Secondary | ICD-10-CM

## 2022-06-23 DIAGNOSIS — K219 Gastro-esophageal reflux disease without esophagitis: Secondary | ICD-10-CM | POA: Insufficient documentation

## 2022-06-23 DIAGNOSIS — Z7982 Long term (current) use of aspirin: Secondary | ICD-10-CM

## 2022-06-23 DIAGNOSIS — K56600 Partial intestinal obstruction, unspecified as to cause: Principal | ICD-10-CM

## 2022-06-23 DIAGNOSIS — Z8249 Family history of ischemic heart disease and other diseases of the circulatory system: Secondary | ICD-10-CM

## 2022-06-23 DIAGNOSIS — N1831 Chronic kidney disease, stage 3a: Secondary | ICD-10-CM | POA: Diagnosis present

## 2022-06-23 DIAGNOSIS — I129 Hypertensive chronic kidney disease with stage 1 through stage 4 chronic kidney disease, or unspecified chronic kidney disease: Secondary | ICD-10-CM | POA: Diagnosis present

## 2022-06-23 DIAGNOSIS — K56609 Unspecified intestinal obstruction, unspecified as to partial versus complete obstruction: Secondary | ICD-10-CM | POA: Diagnosis not present

## 2022-06-23 DIAGNOSIS — Z79899 Other long term (current) drug therapy: Secondary | ICD-10-CM

## 2022-06-23 DIAGNOSIS — R109 Unspecified abdominal pain: Secondary | ICD-10-CM | POA: Diagnosis present

## 2022-06-23 DIAGNOSIS — K2100 Gastro-esophageal reflux disease with esophagitis, without bleeding: Secondary | ICD-10-CM | POA: Diagnosis not present

## 2022-06-23 DIAGNOSIS — I10 Essential (primary) hypertension: Secondary | ICD-10-CM | POA: Diagnosis present

## 2022-06-23 DIAGNOSIS — K5651 Intestinal adhesions [bands], with partial obstruction: Principal | ICD-10-CM | POA: Diagnosis present

## 2022-06-23 DIAGNOSIS — E559 Vitamin D deficiency, unspecified: Secondary | ICD-10-CM | POA: Diagnosis present

## 2022-06-23 DIAGNOSIS — E785 Hyperlipidemia, unspecified: Secondary | ICD-10-CM | POA: Diagnosis present

## 2022-06-23 DIAGNOSIS — Z90711 Acquired absence of uterus with remaining cervical stump: Secondary | ICD-10-CM

## 2022-06-23 DIAGNOSIS — N179 Acute kidney failure, unspecified: Secondary | ICD-10-CM | POA: Diagnosis not present

## 2022-06-23 DIAGNOSIS — E039 Hypothyroidism, unspecified: Secondary | ICD-10-CM | POA: Diagnosis present

## 2022-06-23 LAB — URINALYSIS, ROUTINE W REFLEX MICROSCOPIC
Bilirubin Urine: NEGATIVE
Glucose, UA: NEGATIVE mg/dL
Hgb urine dipstick: NEGATIVE
Ketones, ur: 15 mg/dL — AB
Leukocytes,Ua: NEGATIVE
Nitrite: NEGATIVE
Specific Gravity, Urine: 1.023 (ref 1.005–1.030)
pH: 7.5 (ref 5.0–8.0)

## 2022-06-23 LAB — CBC
HCT: 41.1 % (ref 36.0–46.0)
HCT: 40.3 % (ref 36.0–46.0)
Hemoglobin: 13.3 g/dL (ref 12.0–15.0)
Hemoglobin: 12.9 g/dL (ref 12.0–15.0)
MCH: 29.9 pg (ref 26.0–34.0)
MCH: 29.5 pg (ref 26.0–34.0)
MCHC: 32.4 g/dL (ref 30.0–36.0)
MCHC: 32 g/dL (ref 30.0–36.0)
MCV: 92.4 fL (ref 80.0–100.0)
MCV: 92.2 fL (ref 80.0–100.0)
Platelets: 185 10*3/uL (ref 150–400)
Platelets: 202 10*3/uL (ref 150–400)
RBC: 4.45 MIL/uL (ref 3.87–5.11)
RBC: 4.37 MIL/uL (ref 3.87–5.11)
RDW: 13.2 % (ref 11.5–15.5)
RDW: 13.1 % (ref 11.5–15.5)
WBC: 6.4 10*3/uL (ref 4.0–10.5)
WBC: 6.5 10*3/uL (ref 4.0–10.5)
nRBC: 0 % (ref 0.0–0.2)
nRBC: 0 % (ref 0.0–0.2)

## 2022-06-23 LAB — COMPREHENSIVE METABOLIC PANEL
ALT: 13 U/L (ref 0–44)
AST: 21 U/L (ref 15–41)
Albumin: 4.3 g/dL (ref 3.5–5.0)
Alkaline Phosphatase: 60 U/L (ref 38–126)
Anion gap: 11 (ref 5–15)
BUN: 14 mg/dL (ref 8–23)
CO2: 23 mmol/L (ref 22–32)
Calcium: 10.1 mg/dL (ref 8.9–10.3)
Chloride: 102 mmol/L (ref 98–111)
Creatinine, Ser: 1.08 mg/dL — ABNORMAL HIGH (ref 0.44–1.00)
GFR, Estimated: 50 mL/min — ABNORMAL LOW (ref >60)
Glucose, Bld: 106 mg/dL — ABNORMAL HIGH (ref 70–99)
Potassium: 3.9 mmol/L (ref 3.5–5.1)
Sodium: 136 mmol/L (ref 135–145)
Total Bilirubin: 0.7 mg/dL (ref 0.3–1.2)
Total Protein: 7.8 g/dL (ref 6.5–8.1)

## 2022-06-23 LAB — LIPASE, BLOOD: Lipase: 28 U/L (ref 11–51)

## 2022-06-23 LAB — CREATININE, SERUM
Creatinine, Ser: 1.08 mg/dL — ABNORMAL HIGH (ref 0.44–1.00)
GFR, Estimated: 50 mL/min — ABNORMAL LOW (ref >60)

## 2022-06-23 MED ORDER — MORPHINE SULFATE (PF) 4 MG/ML IV SOLN
4.0000 mg | Freq: Once | INTRAVENOUS | Status: AC
  Administered 2022-06-23: 4 mg via INTRAVENOUS
  Filled 2022-06-23: qty 1

## 2022-06-23 MED ORDER — ONDANSETRON HCL 4 MG PO TABS: 4.0000 mg | ORAL_TABLET | Freq: Four times a day (QID) | ORAL | Status: DC | PRN

## 2022-06-23 MED ORDER — IOHEXOL 300 MG/ML  SOLN
100.0000 mL | Freq: Once | INTRAMUSCULAR | Status: AC | PRN
Start: 2022-06-23 — End: 2022-06-23
  Administered 2022-06-23: 75 mL via INTRAVENOUS

## 2022-06-23 MED ORDER — LEVOTHYROXINE SODIUM 100 MCG/5ML IV SOLN: 75.0000 ug | Freq: Every day | INTRAVENOUS | Status: DC

## 2022-06-23 MED ORDER — ONDANSETRON HCL 4 MG/2ML IJ SOLN: 4.0000 mg | Freq: Four times a day (QID) | INTRAMUSCULAR | Status: DC | PRN

## 2022-06-23 MED ORDER — ENOXAPARIN SODIUM 40 MG/0.4ML IJ SOSY
40.0000 mg | PREFILLED_SYRINGE | INTRAMUSCULAR | Status: DC
  Administered 2022-06-23: 40 mg via SUBCUTANEOUS
  Filled 2022-06-23: qty 0.4

## 2022-06-23 MED ORDER — ONDANSETRON HCL 4 MG/2ML IJ SOLN
4.0000 mg | Freq: Once | INTRAMUSCULAR | Status: AC
  Administered 2022-06-23: 4 mg via INTRAVENOUS
  Filled 2022-06-23: qty 2

## 2022-06-23 MED ORDER — LEVOTHYROXINE SODIUM 100 MCG/5ML IV SOLN: 50.0000 ug | Freq: Every day | INTRAVENOUS | Status: DC

## 2022-06-23 MED ORDER — LACTATED RINGERS IV SOLN: INTRAVENOUS | Status: DC

## 2022-06-23 MED ORDER — ACETAMINOPHEN 650 MG RE SUPP: 650.0000 mg | Freq: Four times a day (QID) | RECTAL | Status: DC | PRN

## 2022-06-23 MED ORDER — PANTOPRAZOLE SODIUM 40 MG IV SOLR
40.0000 mg | INTRAVENOUS | Status: DC
  Administered 2022-06-23 – 2022-06-24 (×2): 40 mg via INTRAVENOUS
  Filled 2022-06-23 (×2): qty 10

## 2022-06-23 MED ORDER — HYDRALAZINE HCL 20 MG/ML IJ SOLN: 10.0000 mg | Freq: Four times a day (QID) | INTRAMUSCULAR | Status: DC | PRN

## 2022-06-23 MED ORDER — MORPHINE SULFATE (PF) 2 MG/ML IV SOLN: 1.0000 mg | INTRAVENOUS | Status: DC | PRN

## 2022-06-23 MED ORDER — ACETAMINOPHEN 325 MG PO TABS: 650.0000 mg | ORAL_TABLET | Freq: Four times a day (QID) | ORAL | Status: DC | PRN

## 2022-06-23 NOTE — H&P (Signed)
History and Physical    Patient: Tammy Page [id number] DOB: Sep 26, 1932 DOA: 06/23/2022 DOS: the patient was seen and examined on 06/23/2022 PCP: Michela Pitcher, MD  Patient coming from: Home  Chief Complaint:  Chief Complaint  Patient presents with   Abdominal Pain   HPI: Tammy Page is a 89 y.o. female with medical history significant of prior bowel obstruction requiring laparotomy/small bowel resection 2022, HTN, GERD, hypothyroidism who presented with abdominal pain.  Per patient-abdominal pain started around 3 AM-it actually woke her up from sleep.  Pain was described as colicky in nature, 10/10 at its severity.  This was associated with at least 2 episodes of nonbloody/nonbilious vomiting.  She has not had a BM today-has had small-volume flatus occasionally throughout the course of the day.  [facility]where she was found to have bowel obstruction on CT imaging.  NG tube was placed, general surgery was consulted-and patient was then transferred to [facility] for [facility] evaluation.  No history of fever, headache, chest pain, shortness of breath.  No history of diarrhea.  No history of hematochezia or melena.  No history of hematuria.  Review of Systems: As mentioned in the history of present illness. All other systems reviewed and are negative. Past Medical History:  Diagnosis Date   Allergic rhinitis    Asymptomatic varicose veins    Carotid bruit    Chest pain    CKD (chronic kidney disease), stage III (HCC)    GERD (gastroesophageal reflux disease)    Glaucoma    Heart palpitations    Hyperlipemia    Hypertension    Hyperthyroidism    Nontoxic multinodular goiter    Osteoarthritis    right knee    Vitamin D deficiency    Past Surgical History:  Procedure Laterality Date   ABDOMINAL HYSTERECTOMY     partial    BOWEL RESECTION N/A 09/03/2020   Procedure: SMALL BOWEL RESECTION;  Surgeon: Griselda Miner, MD;  Location:  [facility];  Service: General;  Laterality: N/A;   LAPAROTOMY N/A 09/03/2020   Procedure: EXPLORATORY LAPAROTOMY;  Surgeon: Griselda Miner, MD;  Location: [facility];  Service: General;  Laterality: N/A;   LYSIS OF ADHESION N/A 09/03/2020   Procedure: LYSIS OF ADHESION;  Surgeon: Griselda Miner, MD;  Location: [facility];  Service: General;  Laterality: N/A;   polyp removed from vagina      SHOULDER OPEN ROTATOR CUFF REPAIR Right 04/07/2012   Procedure: Right Shoulder Open Rotator Cuff Repair with Anchors and Graft,  Excision of Sub-Deltoid Lipoma ;  Surgeon: Jacki Cones, MD;  Location: [facility];  Service: Orthopedics;  Laterality: Right;  Right Shoulder Open Rotator Cuff Repair with Anchors and Graft,  Excision of Sub-Deltoid Lipoma    TONSILLECTOMY     as a child    Social History:  reports that she has never smoked. She has never used smokeless tobacco. She reports that she does not drink alcohol and does not use drugs.  No Known Allergies  Family History  Problem Relation Age of Onset   Hypertension Father    Heart disease Brother    Breast cancer Other    Breast cancer Other    Colon cancer Neg Hx    Stomach cancer Neg Hx    Esophageal cancer Neg Hx    Rectal cancer Neg Hx    Liver cancer Neg Hx     Prior to Admission medications  Medication Sig Start Date End Date Taking? Authorizing Provider  Azelastine HCl 137 MCG/SPRAY SOLN Place 2 sprays into both nostrils 2 (two) times daily. 04/03/22  Yes [provider]  Vitamin D, Ergocalciferol, (DRISDOL) 1.25 MG (50000 UNIT) CAPS capsule Take 50,000 Units by mouth once a week. 04/03/22  Yes [provider]  VYZULTA 0.024 % SOLN Place 1 drop into both eyes at bedtime. 03/30/21  Yes [provider]  aspirin 81 MG tablet Take 81 mg by mouth every morning.    [provider]  brinzolamide (AZOPT) 1 % ophthalmic suspension Place 1 drop into both eyes 2 (two) times daily.    [provider]  diclofenac  sodium (VOLTAREN) 1 % GEL Apply 1 application topically 2 (two) times daily. For arthritis pain    [provider]  HYDROcodone-acetaminophen (NORCO/VICODIN) 5-325 MG tablet Take 1 tablet by mouth every 6 (six) hours as needed for moderate pain. 09/09/20   Abigail Miyamoto, MD  levothyroxine (SYNTHROID) 100 MCG tablet Take 100 mcg by mouth daily before breakfast.    [provider]  lisinopril-hydrochlorothiazide (PRINZIDE,ZESTORETIC) 10-12.5 MG tablet Take 1 tablet by mouth every morning. 12/05/15   [provider]  Multiple Vitamins-Minerals (HAIR/SKIN/NAILS/BIOTIN) TABS Take 2 tablets by mouth every morning.    [provider]  oxymetazoline (AFRIN) 0.05 % nasal spray Place 1 spray into both nostrils 2 (two) times daily as needed for congestion (allergies).    [provider]  Travoprost, BAK Free, (TRAVATAN) 0.004 % SOLN ophthalmic solution Place 1 drop into both eyes at bedtime.    [provider]    Physical Exam: Vitals:   06/23/22 1322 06/23/22 1548 06/23/22 1555 06/23/22 1729  BP: (!) 187/85  (!) 148/85 (!) 156/81  Pulse: 69  89 60  Resp: 18  20 17   Temp: 98.6 F (37 C) 98.2 F (36.8 C)  98 F (36.7 C)  TempSrc: Oral Oral  Oral  SpO2: 100%  100% 99%  Weight: 71.7 kg     Height: 5\' 3"  (1.6 m)      Gen Exam:Alert awake-not in any distress HEENT:atraumatic, normocephalic Chest: B/L clear to auscultation anteriorly CVS:S1S2 regular Abdomen: Soft-tender in the mid abdominal area without any peritoneal signs.  Bowel sounds sluggish.  Extremities:no edema Neurology: Non focal Skin: no rash  Data Reviewed:    Latest Ref Rng & Units 06/23/2022    1:41 PM 09/08/2020    4:52 AM 09/07/2020    5:59 AM  CBC  WBC 4.0 - 10.5 K/uL 6.4  7.0  7.2   Hemoglobin 12.0 - 15.0 g/dL 82.9  9.2  8.0   Hematocrit 36.0 - 46.0 % 41.1  28.1  25.2   Platelets 150 - 400 K/uL 185  219  167         Latest Ref Rng & Units 06/23/2022    1:41 PM  09/08/2020    4:52 AM 09/07/2020    4:56 AM  BMP  Glucose 70 - 99 mg/dL 562  130  87   BUN 8 - 23 mg/dL 14  6  11    Creatinine 0.44 - 1.00 mg/dL 8.65  7.84  6.96   Sodium 135 - 145 mmol/L 136  142  141   Potassium 3.5 - 5.1 mmol/L 3.9  3.5  3.7   Chloride 98 - 111 mmol/L 102  113  114   CO2 22 - 32 mmol/L 23  25  21    Calcium 8.9 - 10.3 mg/dL  10.1  8.8  8.1      Assessment and Plan: SBO Likely due to adhesions-has prior history of bowel resection/laparotomy in 2022 NPO/NG tube to intermittent suction CCS-Dr. Carolynne Edouard notified of [facility]. Await recommendations from general surgery  HTN Hold HCTZ/lisinopril As needed IV hydralazine/ Monitor BP trend  GERD PPI  Hypothyroidism Switch to IV levothyroxine-half of home dosing   Advance Care Planning:   Code Status: Full Code   Consults: General surgery  Family Communication: Niece at bedside  Severity of Illness: The appropriate patient status for this patient is INPATIENT. Inpatient status is judged to be reasonable and necessary in order to provide the required intensity of service to ensure the patient's safety. The patient's presenting symptoms, physical exam findings, and initial radiographic and laboratory data in the context of their chronic comorbidities is felt to place them at high risk for further clinical deterioration. Furthermore, it is not anticipated that the patient will be medically stable for discharge from the hospital within 2 midnights of admission.   * I certify that at the point of admission it is my clinical judgment that the patient will require inpatient hospital care spanning beyond 2 midnights from the point of admission due to high intensity of service, high risk for further deterioration and high frequency of surveillance required.*  Author: Jeoffrey Massed, MD 06/23/2022 6:08 PM  For on call review [email or web address].

## 2022-06-23 NOTE — Treatment Plan (Signed)
[facility] 89 yo with HTN, HLD, hypothyroidism, hysterectomy, bowel resection in 2022 after SBO presented with abdominal pain, nausea, vomiting with imaging suggestive of small bowel obstruction.  Labs unremarkable, vitals with hypertension.  Imaging shows dilated small bowel upstream of the anastomosis concerning for partial distal small bowel obstruction, small volume pelvic fluid.  Dr. Anitra Lauth discussed with surgery who recommended NG tube and admit to hospitalist.  Surgery will consult.    Admit to [facility].

## 2022-06-23 NOTE — ED Provider Notes (Signed)
[place] EMERGENCY DEPARTMENT AT [facility] Provider Note   CSN: [id number] Arrival date & time: 06/23/22  1312     History  Chief Complaint  Patient presents with   Abdominal Pain    ERWIN BARBATI is a 89 y.o. female.  Patient is an 89 year old female with a history of hypertension, hyperlipidemia, hypothyroidism, chronic kidney disease, prior abdominal hysterectomy, bowel resection in 2022 after small bowel obstruction and lysis of adhesions who is presenting today with complaints of abdominal pain and vomiting.  Patient reports yesterday was a normal day then last night she started having mid abdominal pain and had several episodes of vomiting throughout the night.  Her last stool was Sunday morning last night she took MiraLAX and a stool softener but has not been able to have a bowel movement.  She is continue to have abdominal pain and was concern for another bowel obstruction as this feels similar.  She denies any cough, congestion or shortness of breath.  No urinary symptoms.  She denies fever.  The history is provided by the patient.  Abdominal Pain      Home Medications Prior to Admission medications   Medication Sig Start Date End Date Taking? Authorizing Provider  aspirin 81 MG tablet Take 81 mg by mouth every morning.    [provider]  brinzolamide (AZOPT) 1 % ophthalmic suspension Place 1 drop into both eyes 2 (two) times daily.    [provider]  diclofenac sodium (VOLTAREN) 1 % GEL Apply 1 application topically 2 (two) times daily. For arthritis pain    [provider]  HYDROcodone-acetaminophen (NORCO/VICODIN) 5-325 MG tablet Take 1 tablet by mouth every 6 (six) hours as needed for moderate pain. 09/09/20   Abigail Miyamoto, MD  levothyroxine (SYNTHROID) 100 MCG tablet Take 100 mcg by mouth daily before breakfast.    [provider]  levothyroxine (SYNTHROID, LEVOTHROID) 88 MCG tablet Take 88 mcg by mouth daily  before breakfast. Patient not taking: Reported on 09/03/2020    [provider]  lisinopril-hydrochlorothiazide (PRINZIDE,ZESTORETIC) 10-12.5 MG tablet Take 1 tablet by mouth every morning. 12/05/15   [provider]  Multiple Vitamins-Minerals (HAIR/SKIN/NAILS/BIOTIN) TABS Take 2 tablets by mouth every morning.    [provider]  oxymetazoline (AFRIN) 0.05 % nasal spray Place 1 spray into both nostrils 2 (two) times daily as needed for congestion (allergies).    [provider]  RESTASIS 0.05 % ophthalmic emulsion Place 1 drop into both eyes 2 (two) times daily. Patient not taking: Reported on 09/03/2020 11/10/17   [provider]  Travoprost, BAK Free, (TRAVATAN) 0.004 % SOLN ophthalmic solution Place 1 drop into both eyes at bedtime.    [provider]      Allergies    Patient has no known allergies.    Review of Systems   Review of Systems  Gastrointestinal:  Positive for abdominal pain.    Physical Exam Updated Vital Signs BP (!) 187/85 (BP Location: Left Arm)   Pulse 69   Temp 98.6 F (37 C) (Oral)   Resp 18   Ht 5\' 3"  (1.6 m)   Wt 71.7 kg   SpO2 100%   BMI 27.99 kg/m  Physical Exam Vitals and nursing note reviewed.  Constitutional:      General: She is not in acute distress.    Appearance: She is well-developed.  HENT:     Head: Normocephalic and atraumatic.  Eyes:     Pupils: Pupils are equal,  round, and reactive to light.  Cardiovascular:     Rate and Rhythm: Normal rate and regular rhythm.     Heart sounds: Normal heart sounds. No murmur heard.    No friction rub.  Pulmonary:     Effort: Pulmonary effort is normal.     Breath sounds: Normal breath sounds. No wheezing or rales.  Abdominal:     General: Bowel sounds are decreased. There is distension.     Palpations: Abdomen is soft.     Tenderness: There is abdominal tenderness in the periumbilical area. There is no guarding or rebound.  Musculoskeletal:         General: No tenderness. Normal range of motion.     Comments: No edema  Skin:    General: Skin is warm and dry.     Findings: No rash.  Neurological:     Mental Status: She is alert and oriented to person, place, and time.     Cranial Nerves: No cranial nerve deficit.  Psychiatric:        Behavior: Behavior normal.     ED Results / Procedures / Treatments   Labs (all labs ordered are listed, but only abnormal results are displayed) Labs Reviewed  COMPREHENSIVE METABOLIC PANEL - Abnormal; Notable for the following components:      Result Value   Glucose, Bld 106 (*)    Creatinine, Ser 1.08 (*)    GFR, Estimated 50 (*)    All other components within normal limits  LIPASE, BLOOD  CBC  URINALYSIS, ROUTINE W REFLEX MICROSCOPIC    EKG None  Radiology CT ABDOMEN PELVIS W CONTRAST  Result Date: 06/23/2022 CLINICAL DATA:  Acute onset abdominal pain EXAM: CT ABDOMEN AND PELVIS WITH CONTRAST TECHNIQUE: Multidetector CT imaging of the abdomen and pelvis was performed using the standard protocol following bolus administration of intravenous contrast. RADIATION DOSE REDUCTION: This exam was performed according to the departmental dose-optimization program which includes automated exposure control, adjustment of the mA and/or kV according to patient size and/or use of iterative reconstruction technique. CONTRAST:  75mL OMNIPAQUE IOHEXOL 300 MG/ML  SOLN COMPARISON:  CT abdomen and pelvis dated 09/03/2020 FINDINGS: Lower chest: No focal consolidation or pulmonary nodule in the lung bases. No pleural effusion or pneumothorax demonstrated. Partially imaged heart size is normal. Hepatobiliary: No focal hepatic lesions. No intra or extrahepatic biliary ductal dilation. Normal gallbladder. Pancreas: No focal lesions or main ductal dilation. Spleen: Normal in size without focal abnormality. Adrenals/Urinary Tract: No adrenal nodules. No suspicious renal mass, calculi or hydronephrosis. No focal  bladder wall thickening. Stomach/Bowel: Distention of the stomach. Postsurgical changes from right lower quadrant small bowel resection and anastomosis. Dilated small bowel upstream of the anastomosis. Appendix is not discretely seen. Vascular/Lymphatic: Aortic atherosclerosis. No enlarged abdominal or pelvic lymph nodes. Reproductive: No adnexal masses. Other: Small volume pelvic free fluid. No free air or fluid collection. Musculoskeletal: No acute or abnormal lytic or blastic osseous lesions. IMPRESSION: 1. Postsurgical changes from right lower quadrant small bowel resection and anastomosis. Dilated small bowel upstream of the anastomosis, suspicious for partial small bowel obstruction. Distal small bowel is not completely decompressed. 2. Small volume pelvic free fluid. No free air or fluid collection. 3.  Aortic Atherosclerosis (ICD10-I70.0). Electronically Signed   By: Agustin Cree M.D.   On: 06/23/2022 14:57    Procedures Procedures    Medications Ordered in ED Medications  lactated ringers infusion ( Intravenous New Bag/Given 06/23/22 1405)  morphine (PF) 4 MG/ML injection 4  mg (4 mg Intravenous Given 06/23/22 1401)  ondansetron (ZOFRAN) injection 4 mg (4 mg Intravenous Given 06/23/22 1401)  iohexol (OMNIPAQUE) 300 MG/ML solution 100 mL (75 mLs Intravenous Contrast Given 06/23/22 1432)    ED Course/ Medical Decision Making/ A&P                             Medical Decision Making Amount and/or Complexity of Data Reviewed External Data Reviewed: notes.    Details: surgery Labs: ordered. Decision-making details documented in ED Course. Radiology: ordered and independent interpretation performed. Decision-making details documented in ED Course.  Risk Prescription drug management. Decision regarding hospitalization.   Pt with multiple medical problems and comorbidities and presenting today with a complaint that caries a high risk for morbidity and mortality.  Here today with abdominal pain and  vomiting.  Also patient with decreased bowel movements and has not had one since Sunday.  Concern for bowel obstruction especially given patient's prior history.  Also possibility for incarcerated ventral hernia, kidney stone, appendicitis.  I independently interpreted patient's labs lipase, CMP and CBC within normal limits today.  CT is pending.  Patient given fluids pain and nausea control. 3:26 PM I have independently visualized and interpreted pt's images today.  CT of the abdomen pelvis with concern of dilated small bowel.  Radiology reports postsurgical changes in the right lower quadrant with small bowel resection and anastomosis with dilated small bowel upstream of the anastomosis suspicious for a partial small bowel obstruction but the distal small bowel is not completely decompressed.  Will discuss with general surgery.  Patient will most likely need admission for bowel rest.  3:26 PM Spoke with general surgery who recommended NG tube, admission to hospitalist and they will consult on the patient.  This was discussed with the patient.  Hospitalist consulted for admission.  Patient is comfortable with this plan.         Final Clinical Impression(s) / ED Diagnoses Final diagnoses:  Partial small bowel obstruction [facility])    Rx / DC Orders ED Discharge Orders     None         Gwyneth Sprout, MD 06/23/22 1526

## 2022-06-23 NOTE — ED Notes (Signed)
ED TO INPATIENT HANDOFF REPORT  ED Nurse Name and Phone #:    S Name/Age/Gender Tammy Page 89 y.o. female Room/Bed: DB004/DB004  Code Status   Code Status: Prior  Home/SNF/Other Home Patient oriented to: self, place, time, and situation Is this baseline? Yes   Triage Complete: Triage complete  Chief Complaint Small bowel obstruction (HCC) [K56.609]  Triage Note Pt arrives to ED with c/o abdominal pain that started at 3am this morning.    Allergies No Known Allergies  Level of Care/Admitting Diagnosis ED Disposition     ED Disposition  Admit   Condition  --   Comment  Hospital Area: [facility] [[id number]]  Level of Care: Med-Surg [16]  May admit patient to [facility] or [facility] if equivalent level of care is available:: No  Interfacility transfer: Yes  Covid Evaluation: Asymptomatic - no recent exposure (last 10 days) testing not required  Diagnosis: Small bowel obstruction [facility]) [[id number]]  Admitting Physician: Zigmund Daniel [phone]  Attending Physician: Shaune Spittle, Vanice Sarah [phone]  Certification:: I certify this patient will need inpatient services for at least 2 midnights  Estimated Length of Stay: 2          B Medical/Surgery History Past Medical History:  Diagnosis Date   Allergic rhinitis    Asymptomatic varicose veins    Carotid bruit    Chest pain    CKD (chronic kidney disease), stage III (HCC)    GERD (gastroesophageal reflux disease)    Glaucoma    Heart palpitations    Hyperlipemia    Hypertension    Hyperthyroidism    Nontoxic multinodular goiter    Osteoarthritis    right knee    Vitamin D deficiency    Past Surgical History:  Procedure Laterality Date   ABDOMINAL HYSTERECTOMY     partial    BOWEL RESECTION N/A 09/03/2020   Procedure: SMALL BOWEL RESECTION;  Surgeon: Griselda Miner, MD;  Location: [facility];  Service: General;  Laterality: N/A;   LAPAROTOMY N/A 09/03/2020   Procedure:  EXPLORATORY LAPAROTOMY;  Surgeon: Griselda Miner, MD;  Location: [facility];  Service: General;  Laterality: N/A;   LYSIS OF ADHESION N/A 09/03/2020   Procedure: LYSIS OF ADHESION;  Surgeon: Griselda Miner, MD;  Location: [facility];  Service: General;  Laterality: N/A;   polyp removed from vagina      SHOULDER OPEN ROTATOR CUFF REPAIR Right 04/07/2012   Procedure: Right Shoulder Open Rotator Cuff Repair with Anchors and Graft,  Excision of Sub-Deltoid Lipoma ;  Surgeon: Jacki Cones, MD;  Location: [facility];  Service: Orthopedics;  Laterality: Right;  Right Shoulder Open Rotator Cuff Repair with Anchors and Graft,  Excision of Sub-Deltoid Lipoma    TONSILLECTOMY     as a child      A IV Location/Drains/Wounds Patient Lines/Drains/Airways Status     Active Line/Drains/Airways     Name Placement date Placement time Site Days   Peripheral IV 06/23/22 20 G 1.88" Anterior;Left Forearm 06/23/22  1342  Forearm  less than 1   NG/OG Vented/Dual Lumen 14 Fr. Left nare Marking at nare/corner of mouth 60 cm 06/23/22  1556  Left nare  less than 1   Incision (Closed) 09/03/20 Abdomen Other (Comment) 09/03/20  1705  -- 658            Intake/Output Last 24 hours No intake or output data in the 24 hours ending 06/23/22 1619  Labs/Imaging  Results for orders placed or performed during the hospital encounter of 06/23/22 (from the past 48 hour(s))  Lipase, blood     Status: None   Collection Time: 06/23/22  1:41 PM  Result Value Ref Range   Lipase 28 11 - 51 U/L    Comment: Performed at [facility], [address], [place], [place] [address]  Comprehensive metabolic panel     Status: Abnormal   Collection Time: 06/23/22  1:41 PM  Result Value Ref Range   Sodium 136 135 - 145 mmol/L   Potassium 3.9 3.5 - 5.1 mmol/L   Chloride 102 98 - 111 mmol/L   CO2 23 22 - 32 mmol/L   Glucose, Bld 106 (H) 70 - 99 mg/dL    Comment: Glucose reference range applies only to samples taken after  fasting for at least 8 hours.   BUN 14 8 - 23 mg/dL   Creatinine, Ser 9.14 (H) 0.44 - 1.00 mg/dL   Calcium 78.2 8.9 - 95.6 mg/dL   Total Protein 7.8 6.5 - 8.1 g/dL   Albumin 4.3 3.5 - 5.0 g/dL   AST 21 15 - 41 U/L   ALT 13 0 - 44 U/L   Alkaline Phosphatase 60 38 - 126 U/L   Total Bilirubin 0.7 0.3 - 1.2 mg/dL   GFR, Estimated 50 (L) >60 mL/min    Comment: (NOTE) Calculated using the CKD-EPI Creatinine Equation (2021)    Anion gap 11 5 - 15    Comment: Performed at [facility], [address], [place], [place] [address]  CBC     Status: None   Collection Time: 06/23/22  1:41 PM  Result Value Ref Range   WBC 6.4 4.0 - 10.5 K/uL   RBC 4.45 3.87 - 5.11 MIL/uL   Hemoglobin 13.3 12.0 - 15.0 g/dL   HCT 65.7 84.6 - 96.2 %   MCV 92.4 80.0 - 100.0 fL   MCH 29.9 26.0 - 34.0 pg   MCHC 32.4 30.0 - 36.0 g/dL   RDW 95.2 84.1 - 32.4 %   Platelets 185 150 - 400 K/uL   nRBC 0.0 0.0 - 0.2 %    Comment: Performed at [facility], [address], [place], [place] [address]  Urinalysis, Routine w reflex microscopic -Urine, Clean Catch     Status: Abnormal   Collection Time: 06/23/22  1:41 PM  Result Value Ref Range   Color, Urine YELLOW YELLOW   APPearance CLEAR CLEAR   Specific Gravity, Urine 1.023 1.005 - 1.030   pH 7.5 5.0 - 8.0   Glucose, UA NEGATIVE NEGATIVE mg/dL   Hgb urine dipstick NEGATIVE NEGATIVE   Bilirubin Urine NEGATIVE NEGATIVE   Ketones, ur 15 (A) NEGATIVE mg/dL   Protein, ur TRACE (A) NEGATIVE mg/dL   Nitrite NEGATIVE NEGATIVE   Leukocytes,Ua NEGATIVE NEGATIVE    Comment: Performed at [facility], [address], [place], [place] [address]   DG Abd Portable 1 View  Result Date: 06/23/2022 CLINICAL DATA:  NG tube confirmation EXAM: PORTABLE ABDOMEN - 1 VIEW COMPARISON:  None Available. FINDINGS: Nasogastric tube tip is in the body of the stomach. The bowel gas pattern is normal. No radio-opaque calculi or other  significant radiographic abnormality are seen. IMPRESSION: Nasogastric tube tip is in the body of the stomach. Electronically Signed   By: Darliss Cheney M.D.   On: 06/23/2022 16:08   CT ABDOMEN PELVIS W CONTRAST  Result Date: 06/23/2022 CLINICAL DATA:  Acute onset  abdominal pain EXAM: CT ABDOMEN AND PELVIS WITH CONTRAST TECHNIQUE: Multidetector CT imaging of the abdomen and pelvis was performed using the standard protocol following bolus administration of intravenous contrast. RADIATION DOSE REDUCTION: This exam was performed according to the departmental dose-optimization program which includes automated exposure control, adjustment of the mA and/or kV according to patient size and/or use of iterative reconstruction technique. CONTRAST:  75mL OMNIPAQUE IOHEXOL 300 MG/ML  SOLN COMPARISON:  CT abdomen and pelvis dated 09/03/2020 FINDINGS: Lower chest: No focal consolidation or pulmonary nodule in the lung bases. No pleural effusion or pneumothorax demonstrated. Partially imaged heart size is normal. Hepatobiliary: No focal hepatic lesions. No intra or extrahepatic biliary ductal dilation. Normal gallbladder. Pancreas: No focal lesions or main ductal dilation. Spleen: Normal in size without focal abnormality. Adrenals/Urinary Tract: No adrenal nodules. No suspicious renal mass, calculi or hydronephrosis. No focal bladder wall thickening. Stomach/Bowel: Distention of the stomach. Postsurgical changes from right lower quadrant small bowel resection and anastomosis. Dilated small bowel upstream of the anastomosis. Appendix is not discretely seen. Vascular/Lymphatic: Aortic atherosclerosis. No enlarged abdominal or pelvic lymph nodes. Reproductive: No adnexal masses. Other: Small volume pelvic free fluid. No free air or fluid collection. Musculoskeletal: No acute or abnormal lytic or blastic osseous lesions. IMPRESSION: 1. Postsurgical changes from right lower quadrant small bowel resection and anastomosis. Dilated  small bowel upstream of the anastomosis, suspicious for partial small bowel obstruction. Distal small bowel is not completely decompressed. 2. Small volume pelvic free fluid. No free air or fluid collection. 3.  Aortic Atherosclerosis (ICD10-I70.0). Electronically Signed   By: Agustin Cree M.D.   On: 06/23/2022 14:57    Pending Labs Unresulted Labs (From admission, onward)    None       Vitals/Pain Today's Vitals   06/23/22 1322 06/23/22 1410 06/23/22 1548 06/23/22 1555  BP: (!) 187/85   (!) 148/85  Pulse: 69   89  Resp: 18   20  Temp: 98.6 F (37 C)  98.2 F (36.8 C)   TempSrc: Oral  Oral   SpO2: 100%   100%  Weight: 71.7 kg     Height: 5\' 3"  (1.6 m)     PainSc: 10-Worst pain ever 9       Isolation Precautions No active isolations  Medications Medications  lactated ringers infusion ( Intravenous New Bag/Given 06/23/22 1405)  morphine (PF) 4 MG/ML injection 4 mg (4 mg Intravenous Given 06/23/22 1401)  ondansetron (ZOFRAN) injection 4 mg (4 mg Intravenous Given 06/23/22 1401)  iohexol (OMNIPAQUE) 300 MG/ML solution 100 mL (75 mLs Intravenous Contrast Given 06/23/22 1432)    Mobility walks     Focused Assessments Pulmonary Assessment Handoff:  Lung sounds:   O2 Device: Room Air      R Recommendations: See Admitting Provider Note  Report given to:   Additional Notes:

## 2022-06-23 NOTE — ED Triage Notes (Signed)
Pt arrives to ED with c/o abdominal pain that started at 3am this morning.

## 2022-06-24 ENCOUNTER — Inpatient Hospital Stay (HOSPITAL_COMMUNITY): Payer: Medicare HMO

## 2022-06-24 DIAGNOSIS — I10 Essential (primary) hypertension: Secondary | ICD-10-CM | POA: Diagnosis not present

## 2022-06-24 DIAGNOSIS — K56600 Partial intestinal obstruction, unspecified as to cause: Secondary | ICD-10-CM | POA: Diagnosis not present

## 2022-06-24 LAB — CBC
HCT: 37.9 % (ref 36.0–46.0)
Hemoglobin: 12.3 g/dL (ref 12.0–15.0)
MCH: 30.1 pg (ref 26.0–34.0)
MCHC: 32.5 g/dL (ref 30.0–36.0)
MCV: 92.9 fL (ref 80.0–100.0)
Platelets: 193 10*3/uL (ref 150–400)
RBC: 4.08 MIL/uL (ref 3.87–5.11)
RDW: 13.2 % (ref 11.5–15.5)
WBC: 6.5 10*3/uL (ref 4.0–10.5)
nRBC: 0 % (ref 0.0–0.2)

## 2022-06-24 LAB — COMPREHENSIVE METABOLIC PANEL
ALT: 14 U/L (ref 0–44)
AST: 16 U/L (ref 15–41)
Albumin: 3.4 g/dL — ABNORMAL LOW (ref 3.5–5.0)
Alkaline Phosphatase: 54 U/L (ref 38–126)
Anion gap: 9 (ref 5–15)
BUN: 11 mg/dL (ref 8–23)
CO2: 25 mmol/L (ref 22–32)
Calcium: 9.2 mg/dL (ref 8.9–10.3)
Chloride: 101 mmol/L (ref 98–111)
Creatinine, Ser: 1.24 mg/dL — ABNORMAL HIGH (ref 0.44–1.00)
GFR, Estimated: 42 mL/min — ABNORMAL LOW (ref >60)
Glucose, Bld: 88 mg/dL (ref 70–99)
Potassium: 3.6 mmol/L (ref 3.5–5.1)
Sodium: 135 mmol/L (ref 135–145)
Total Bilirubin: 1.1 mg/dL (ref 0.3–1.2)
Total Protein: 6.1 g/dL — ABNORMAL LOW (ref 6.5–8.1)

## 2022-06-24 MED ORDER — SODIUM CHLORIDE 0.9 % IV BOLUS
1000.0000 mL | Freq: Once | INTRAVENOUS | Status: AC
  Administered 2022-06-24: 1000 mL via INTRAVENOUS

## 2022-06-24 MED ORDER — DIATRIZOATE MEGLUMINE & SODIUM 66-10 % PO SOLN
90.0000 mL | Freq: Once | ORAL | Status: AC
  Administered 2022-06-24: 90 mL via NASOGASTRIC
  Filled 2022-06-24: qty 90

## 2022-06-24 MED ORDER — LACTATED RINGERS IV SOLN: INTRAVENOUS | Status: DC

## 2022-06-24 MED ORDER — SODIUM CHLORIDE 0.9 % IV SOLN: INTRAVENOUS | Status: AC

## 2022-06-24 MED ORDER — POTASSIUM CHLORIDE 10 MEQ/100ML IV SOLN
10.0000 meq | INTRAVENOUS | Status: AC
  Administered 2022-06-24 (×5): 10 meq via INTRAVENOUS
  Filled 2022-06-24 (×5): qty 100

## 2022-06-24 MED ORDER — ENOXAPARIN SODIUM 30 MG/0.3ML IJ SOSY
30.0000 mg | PREFILLED_SYRINGE | INTRAMUSCULAR | Status: DC
  Administered 2022-06-24: 30 mg via SUBCUTANEOUS
  Filled 2022-06-24: qty 0.3

## 2022-06-24 NOTE — Evaluation (Signed)
Physical Therapy Evaluation Patient Details Name: Tammy Page MRN: [id number] DOB: 10-20-1932 Today's Date: 06/24/2022  History of Present Illness  89 yo female admitted with AKI, Abd pain, vomiting. Dx SBO. Hx of SBO, SB resection 2022, OA, CKD, hypothyroidism  Clinical Impression  On eval, pt was Min guard A for mobility. She walked ~150 feet while holding on to IV pole for support. Pt tolerated activity well. Will plan to follow and progress activity as tolerated. May not need any f/u therapy if pt continues to progress well.        Recommendations for follow up therapy are one component of a multi-disciplinary discharge planning process, led by the attending physician.  Recommendations may be updated based on patient status, additional functional criteria and insurance authorization.  Follow Up Recommendations       Assistance Recommended at Discharge PRN  Patient can return home with the following  A little help with walking and/or transfers;A little help with bathing/dressing/bathroom;Assistance with cooking/housework;Assist for transportation;Help with stairs or ramp for entrance    Equipment Recommendations None recommended by PT  Recommendations for Other Services       Functional Status Assessment Patient has had a recent decline in their functional status and demonstrates the ability to make significant improvements in function in a reasonable and predictable amount of time.     Precautions / Restrictions Precautions Precautions: Fall Precaution Comments: NG tube Restrictions Weight Bearing Restrictions: No      Mobility  Bed Mobility Overal bed mobility: Modified Independent                  Transfers Overall transfer level: Needs assistance   Transfers: Sit to/from Stand Sit to Stand: Supervision           General transfer comment: Supv for safety    Ambulation/Gait Ambulation/Gait assistance: Min guard Gait Distance (Feet): 150  Feet Assistive device: IV Pole Gait Pattern/deviations: Step-through pattern, Decreased stride length       General Gait Details: Min guard for safety. Pt used IV pole for steadying but also walked a short distance without it-no LOB. Tolerated distance well.  Stairs            Wheelchair Mobility    Modified Rankin (Stroke Patients Only)       Balance Overall balance assessment: Mild deficits observed, not formally tested                                           Pertinent Vitals/Pain Pain Assessment Pain Assessment: No/denies pain    Home Living Family/patient expects to be discharged to:: Private residence Living Arrangements: Alone Available Help at Discharge: Family;Available PRN/intermittently Type of Home: House Home Access: Level entry       Home Layout: One level Home Equipment: Agricultural consultant (2 wheels);Cane - single point      Prior Function Prior Level of Function : Independent/Modified Independent             Mobility Comments: independent. still driving.       Hand Dominance        Extremity/Trunk Assessment   Upper Extremity Assessment Upper Extremity Assessment: Overall WFL for tasks assessed    Lower Extremity Assessment Lower Extremity Assessment: Generalized weakness    Cervical / Trunk Assessment Cervical / Trunk Assessment: Normal  Communication   Communication: No difficulties  Cognition Arousal/Alertness: Awake/alert  Behavior During Therapy: WFL for tasks assessed/performed Overall Cognitive Status: Within Functional Limits for tasks assessed                                          General Comments      Exercises     Assessment/Plan    PT Assessment Patient needs continued PT services  PT Problem List Decreased strength;Decreased activity tolerance;Decreased balance;Decreased mobility       PT Treatment Interventions DME instruction;Therapeutic exercise;Gait  training;Balance training;Functional mobility training;Therapeutic activities;Patient/family education    PT Goals (Current goals can be found in the Care Plan section)  Acute Rehab PT Goals Patient Stated Goal: no surgery PT Goal Formulation: With patient Time For Goal Achievement: 07/08/22 Potential to Achieve Goals: Good    Frequency Min 1X/week     Co-evaluation               AM-PAC PT "6 Clicks" Mobility  Outcome Measure Help needed turning from your back to your side while in a flat bed without using bedrails?: None Help needed moving from lying on your back to sitting on the side of a flat bed without using bedrails?: None Help needed moving to and from a bed to a chair (including a wheelchair)?: A Little Help needed standing up from a chair using your arms (e.g., wheelchair or bedside chair)?: A Little Help needed to walk in hospital room?: A Little Help needed climbing 3-5 steps with a railing? : A Little 6 Click Score: 20    End of Session   Activity Tolerance: Patient tolerated treatment well Patient left: in bed;with call bell/phone within reach;with bed alarm set;with nursing/sitter in room   PT Visit Diagnosis: Unsteadiness on feet (R26.81)    Time: 0865-7846 PT Time Calculation (min) (ACUTE ONLY): 16 min   Charges:   PT Evaluation $PT Eval Low Complexity: 1 Low             Faye Ramsay, PT Acute Rehabilitation  Office: [phone]

## 2022-06-24 NOTE — Progress Notes (Signed)
NG clamped at 1130 after gastrografin administered. NG hooked back up to low wall intermittent suction at 1300. Patient had one medium sized bowel movement that she stated was a type 6 around 1430 and a large bowel movement that she stated was a type 5 around 1730. She states she is now starting to feel better.   Smiley Houseman, RN

## 2022-06-24 NOTE — Consult Note (Signed)
Tammy Page Tammy Page [birth year]  [id number].    Requesting MD: Dr. Jeoffrey Massed Chief Complaint/Reason for Consult: small bowel obstruction  HPI:  Tammy Page is an 89 yo female who presented to the ED with abdominal pain, distension and vomiting. Her symptoms began yesterday morning. She last had a bowel movement two days ago. A CT scan showed a small bowel obstruction. An NG tube was placed and she was admitted at [facility] last night. She says her distension has improved and she is passing small amounts of flatus. Her pain has improved.  Her only previous abdominal surgery is an ex lap and small bowel resection in July 2022 by Dr. Carolynne Edouard for an SBO.   ROS: Review of Systems  Constitutional:  Negative for chills and fever.  Respiratory:  Negative for shortness of breath.   Gastrointestinal:  Positive for abdominal pain, nausea and vomiting.    Family History  Problem Relation Age of Onset   Hypertension Father    Heart disease Brother    Breast cancer Other    Breast cancer Other    Colon cancer Neg Hx    Stomach cancer Neg Hx    Esophageal cancer Neg Hx    Rectal cancer Neg Hx    Liver cancer Neg Hx     Past Medical History:  Diagnosis Date   Allergic rhinitis    Asymptomatic varicose veins    Carotid bruit    Chest pain    CKD (chronic kidney disease), stage III (HCC)    GERD (gastroesophageal reflux disease)    Glaucoma    Heart palpitations    Hyperlipemia    Hypertension    Hyperthyroidism    Nontoxic multinodular goiter    Osteoarthritis    right knee    Vitamin D deficiency     Past Surgical History:  Procedure Laterality Date   ABDOMINAL HYSTERECTOMY     partial    BOWEL RESECTION N/A 09/03/2020   Procedure: SMALL BOWEL RESECTION;  Surgeon: Griselda Miner, MD;  Location: [facility];  Service: General;  Laterality: N/A;   LAPAROTOMY N/A 09/03/2020   Procedure: EXPLORATORY LAPAROTOMY;  Surgeon: Griselda Miner, MD;  Location: [facility];  Service: General;   Laterality: N/A;   LYSIS OF ADHESION N/A 09/03/2020   Procedure: LYSIS OF ADHESION;  Surgeon: Griselda Miner, MD;  Location: [facility];  Service: General;  Laterality: N/A;   polyp removed from vagina      SHOULDER OPEN ROTATOR CUFF REPAIR Right 04/07/2012   Procedure: Right Shoulder Open Rotator Cuff Repair with Anchors and Graft,  Excision of Sub-Deltoid Lipoma ;  Surgeon: Jacki Cones, MD;  Location: [facility];  Service: Orthopedics;  Laterality: Right;  Right Shoulder Open Rotator Cuff Repair with Anchors and Graft,  Excision of Sub-Deltoid Lipoma    TONSILLECTOMY     as a child     Social History:  reports that she has never smoked. She has never used smokeless tobacco. She reports that she does not drink alcohol and does not use drugs.  Allergies: No Known Allergies  Medications Prior to Admission  Medication Sig Dispense Refill   aspirin 81 MG tablet Take 81 mg by mouth every morning.     Azelastine HCl 137 MCG/SPRAY SOLN Place 2 sprays into both nostrils 2 (two) times daily.     AZOPT 1 % ophthalmic suspension Place 1 drop into both eyes 3 (three) times daily.     diclofenac sodium (VOLTAREN)  1 % GEL Apply 2 g topically See admin instructions. Apply to affected areas in the morning and evening     FIBER ADULT GUMMIES PO Take 2 tablets by mouth daily.     levothyroxine (SYNTHROID) 100 MCG tablet Take 100 mcg by mouth daily before breakfast.     lisinopril-hydrochlorothiazide (PRINZIDE,ZESTORETIC) 10-12.5 MG tablet Take 1 tablet by mouth every morning.     Melatonin 5 MG CHEW Chew 5 mg by mouth at bedtime.     Multiple Vitamins-Minerals (HAIR/SKIN/NAILS/BIOTIN) TABS Take 2 tablets by mouth every morning.     RESTASIS 0.05 % ophthalmic emulsion Place 1 drop into both eyes in the morning and at bedtime.     Vitamin D, Ergocalciferol, (DRISDOL) 1.25 MG (50000 UNIT) CAPS capsule Take 50,000 Units by mouth every Monday.     VYZULTA 0.024 % SOLN Place 1 drop into both eyes at bedtime.      HYDROcodone-acetaminophen (NORCO/VICODIN) 5-325 MG tablet Take 1 tablet by mouth every 6 (six) hours as needed for moderate pain. (Patient not taking: Reported on 06/23/2022) 20 tablet 0     Physical Exam: Blood pressure 135/83, pulse 72, temperature 98.2 F (36.8 C), temperature source Oral, resp. rate 16, height 5\' 3"  (1.6 m), weight 71.7 kg, SpO2 95 %. General: resting comfortably, appears stated age, no apparent distress Neurological: alert and oriented, no focal deficits, cranial nerves grossly in tact HEENT: normocephalic, atraumatic, NG tube in place draining gastric contents Respiratory: normal work of breathing on room air Abdomen: soft, mildly distended, mild tenderness on the right side, no rebound tenderness or guarding. Well-healed midline laparotomy scar. Extremities: warm and well-perfused, no deformities, moving all extremities spontaneously Psychiatric: normal mood and affect Skin: warm and dry, no jaundice, no rashes or lesions   Results for orders placed or performed during the hospital encounter of 06/23/22 (from the past 48 hour(s))  Lipase, blood     Status: None   Collection Time: 06/23/22  1:41 PM  Result Value Ref Range   Lipase 28 11 - 51 U/L    Comment: Performed at [facility], [address] [place], [place] [address]  Comprehensive metabolic panel     Status: Abnormal   Collection Time: 06/23/22  1:41 PM  Result Value Ref Range   Sodium 136 135 - 145 mmol/L   Potassium 3.9 3.5 - 5.1 mmol/L   Chloride 102 98 - 111 mmol/L   CO2 23 22 - 32 mmol/L   Glucose, Bld 106 (H) 70 - 99 mg/dL    Comment: Glucose reference range applies only to samples taken after fasting for at least 8 hours.   BUN 14 8 - 23 mg/dL   Creatinine, Ser 6.04 (H) 0.44 - 1.00 mg/dL   Calcium 54.0 8.9 - 98.1 mg/dL   Total Protein 7.8 6.5 - 8.1 g/dL   Albumin 4.3 3.5 - 5.0 g/dL   AST 21 15 - 41 U/L   ALT 13 0 - 44 U/L   Alkaline Phosphatase 60 38 - 126 U/L   Total  Bilirubin 0.7 0.3 - 1.2 mg/dL   GFR, Estimated 50 (L) >60 mL/min    Comment: (NOTE) Calculated using the CKD-EPI Creatinine Equation (2021)    Anion gap 11 5 - 15    Comment: Performed at [facility], [address] [place], [place] [address]  CBC     Status: None   Collection Time: 06/23/22  1:41 PM  Result Value Ref Range   WBC 6.4  4.0 - 10.5 K/uL   RBC 4.45 3.87 - 5.11 MIL/uL   Hemoglobin 13.3 12.0 - 15.0 g/dL   HCT 40.9 81.1 - 91.4 %   MCV 92.4 80.0 - 100.0 fL   MCH 29.9 26.0 - 34.0 pg   MCHC 32.4 30.0 - 36.0 g/dL   RDW 78.2 95.6 - 21.3 %   Platelets 185 150 - 400 K/uL   nRBC 0.0 0.0 - 0.2 %    Comment: Performed at [facility], [address], [place], [place] [address]  Urinalysis, Routine w reflex microscopic -Urine, Clean Catch     Status: Abnormal   Collection Time: 06/23/22  1:41 PM  Result Value Ref Range   Color, Urine YELLOW YELLOW   APPearance CLEAR CLEAR   Specific Gravity, Urine 1.023 1.005 - 1.030   pH 7.5 5.0 - 8.0   Glucose, UA NEGATIVE NEGATIVE mg/dL   Hgb urine dipstick NEGATIVE NEGATIVE   Bilirubin Urine NEGATIVE NEGATIVE   Ketones, ur 15 (A) NEGATIVE mg/dL   Protein, ur TRACE (A) NEGATIVE mg/dL   Nitrite NEGATIVE NEGATIVE   Leukocytes,Ua NEGATIVE NEGATIVE    Comment: Performed at [facility], [address] [place], [place] [address]  CBC     Status: None   Collection Time: 06/23/22  6:22 PM  Result Value Ref Range   WBC 6.5 4.0 - 10.5 K/uL   RBC 4.37 3.87 - 5.11 MIL/uL   Hemoglobin 12.9 12.0 - 15.0 g/dL   HCT 29.5 28.4 - 13.2 %   MCV 92.2 80.0 - 100.0 fL   MCH 29.5 26.0 - 34.0 pg   MCHC 32.0 30.0 - 36.0 g/dL   RDW 44.0 10.2 - 72.5 %   Platelets 202 150 - 400 K/uL   nRBC 0.0 0.0 - 0.2 %    Comment: Performed at [facility], [address], [place], [place] [address]  Creatinine, serum     Status: Abnormal   Collection Time: 06/23/22  6:22 PM  Result Value Ref  Range   Creatinine, Ser 1.08 (H) 0.44 - 1.00 mg/dL   GFR, Estimated 50 (L) >60 mL/min    Comment: (NOTE) Calculated using the CKD-EPI Creatinine Equation (2021) Performed at [facility], [address] [place], [place] [address]   CBC     Status: None   Collection Time: 06/24/22  5:24 AM  Result Value Ref Range   WBC 6.5 4.0 - 10.5 K/uL   RBC 4.08 3.87 - 5.11 MIL/uL   Hemoglobin 12.3 12.0 - 15.0 g/dL   HCT 25.9 56.3 - 87.5 %   MCV 92.9 80.0 - 100.0 fL   MCH 30.1 26.0 - 34.0 pg   MCHC 32.5 30.0 - 36.0 g/dL   RDW 64.3 32.9 - 51.8 %   Platelets 193 150 - 400 K/uL   nRBC 0.0 0.0 - 0.2 %    Comment: Performed at [facility], [address], [place], [place] [address]  Comprehensive metabolic panel     Status: Abnormal   Collection Time: 06/24/22  5:24 AM  Result Value Ref Range   Sodium 135 135 - 145 mmol/L   Potassium 3.6 3.5 - 5.1 mmol/L   Chloride 101 98 - 111 mmol/L   CO2 25 22 - 32 mmol/L   Glucose, Bld 88 70 - 99 mg/dL    Comment: Glucose reference range applies only to samples taken after fasting for at least 8 hours.   BUN 11 8 -  23 mg/dL   Creatinine, Ser 4.09 (H) 0.44 - 1.00 mg/dL   Calcium 9.2 8.9 - 81.1 mg/dL   Total Protein 6.1 (L) 6.5 - 8.1 g/dL   Albumin 3.4 (L) 3.5 - 5.0 g/dL   AST 16 15 - 41 U/L   ALT 14 0 - 44 U/L   Alkaline Phosphatase 54 38 - 126 U/L   Total Bilirubin 1.1 0.3 - 1.2 mg/dL   GFR, Estimated 42 (L) >60 mL/min    Comment: (NOTE) Calculated using the CKD-EPI Creatinine Equation (2021)    Anion gap 9 5 - 15    Comment: Performed at [facility], [address] [place], [place] [address]   DG Abd Portable 1 View  Result Date: 06/23/2022 CLINICAL DATA:  NG tube confirmation EXAM: PORTABLE ABDOMEN - 1 VIEW COMPARISON:  None Available. FINDINGS: Nasogastric tube tip is in the body of the stomach. The bowel gas pattern is normal. No radio-opaque calculi or other significant radiographic  abnormality are seen. IMPRESSION: Nasogastric tube tip is in the body of the stomach. Electronically Signed   By: Darliss Cheney M.D.   On: 06/23/2022 16:08   CT ABDOMEN PELVIS W CONTRAST  Result Date: 06/23/2022 CLINICAL DATA:  Acute onset abdominal pain EXAM: CT ABDOMEN AND PELVIS WITH CONTRAST TECHNIQUE: Multidetector CT imaging of the abdomen and pelvis was performed using the standard protocol following bolus administration of intravenous contrast. RADIATION DOSE REDUCTION: This exam was performed according to the departmental dose-optimization program which includes automated exposure control, adjustment of the mA and/or kV according to patient size and/or use of iterative reconstruction technique. CONTRAST:  75mL OMNIPAQUE IOHEXOL 300 MG/ML  SOLN COMPARISON:  CT abdomen and pelvis dated 09/03/2020 FINDINGS: Lower chest: No focal consolidation or pulmonary nodule in the lung bases. No pleural effusion or pneumothorax demonstrated. Partially imaged heart size is normal. Hepatobiliary: No focal hepatic lesions. No intra or extrahepatic biliary ductal dilation. Normal gallbladder. Pancreas: No focal lesions or main ductal dilation. Spleen: Normal in size without focal abnormality. Adrenals/Urinary Tract: No adrenal nodules. No suspicious renal mass, calculi or hydronephrosis. No focal bladder wall thickening. Stomach/Bowel: Distention of the stomach. Postsurgical changes from right lower quadrant small bowel resection and anastomosis. Dilated small bowel upstream of the anastomosis. Appendix is not discretely seen. Vascular/Lymphatic: Aortic atherosclerosis. No enlarged abdominal or pelvic lymph nodes. Reproductive: No adnexal masses. Other: Small volume pelvic free fluid. No free air or fluid collection. Musculoskeletal: No acute or abnormal lytic or blastic osseous lesions. IMPRESSION: 1. Postsurgical changes from right lower quadrant small bowel resection and anastomosis. Dilated small bowel upstream of the  anastomosis, suspicious for partial small bowel obstruction. Distal small bowel is not completely decompressed. 2. Small volume pelvic free fluid. No free air or fluid collection. 3.  Aortic Atherosclerosis (ICD10-I70.0). Electronically Signed   By: Agustin Cree M.D.   On: 06/23/2022 14:57      Assessment/Plan 89 yo female with a prior history of small bowel resection for small bowel obstruction, presenting with nausea/vomiting and abdominal pain. I personally reviewed her labs, imaging and notes. Her CT scan shows dilated proximal small bowel, consistent with an SBO, likely adhesive. She is passing some flatus. Will proceed with the SBO protocol with gastrografin this morning. Continue nonoperative management with NG decompression. No indication for acute surgical intervention.  Level of MDM: Moderate   Sophronia Simas, MD [facility] Surgery General, Hepatobiliary and Pancreatic Surgery 06/24/22 8:53 AM

## 2022-06-24 NOTE — Progress Notes (Signed)
[facility] PROGRESS NOTE    Progress Note  Tammy Page  WUJ:[id number] DOB: Jan 26, 1933 DOA: 06/23/2022 PCP: Michela Pitcher, MD     Brief Narrative:   Tammy Page is an 89 y.o. female past medical history significant for bowel obstruction requiring laparotomy and small bowel resection in 2022, essential hypertension comes in for abdominal pain that woke her up in the middle of the night.  CT scan of the abdomen pelvis showed small bowel obstruction. G-tube was placed, general surgery was consulted.  Assessment/Plan:   Small bowel obstruction [facility]): He had a history of bowel resection in 2022. Likely due to adhesions Currently n.p.o. NG tube in place continue intermittent suction.  About 500 cc out through her output. Will start on the small bowel protocol.   Awaiting general surgery further recommendations. Started on IV fluids try to keep potassium greater than 4 magnesium greater than 2. Check potassium tomorrow morning.  New acute kidney injury: With a baseline creatinine of less than 1, specific gravity on UA was 1023 Likely hemodynamically mediated, will give her a bolus of normal saline and continue IV normal saline infusion. Metabolic panel in the morning.  Essential hypertension: Hold ACE inhibitor and diuretics in the setting of acute kidney injury. Blood pressure is improved this morning. Hydralazine IV as needed.  Hypothyroidism Continue Synthroid IV.  GERD (gastroesophageal reflux disease) Protonix daily.   DVT prophylaxis: lovenox Family Communication:none Status is: Inpatient Remains inpatient appropriate because: No bowel obstruction.    Code Status:     Code Status Orders  (From admission, onward)           Start     Ordered   06/23/22 1807  Full code  Continuous       Question:  By:  Answer:  Consent: discussion documented in EHR   06/23/22 1807           Code Status History     Date Active Date Inactive Code Status  Order ID Comments User Context   09/03/2020 1831 09/09/2020 1828 Full Code [id number]  Princella Pellegrini Inpatient   04/07/2012 1559 04/07/2012 1739 Full Code [id number]  Jacki Cones, MD Inpatient      Advance Directive Documentation    Flowsheet Row Most Recent Value  Type of Advance Directive Healthcare Power of Attorney, Living will  Pre-existing out of facility DNR order (yellow form or pink MOST form) --  "MOST" Form in Place? --         IV Access:   Peripheral IV   Procedures and diagnostic studies:   DG Abd Portable 1 View  Result Date: 06/23/2022 CLINICAL DATA:  NG tube confirmation EXAM: PORTABLE ABDOMEN - 1 VIEW COMPARISON:  None Available. FINDINGS: Nasogastric tube tip is in the body of the stomach. The bowel gas pattern is normal. No radio-opaque calculi or other significant radiographic abnormality are seen. IMPRESSION: Nasogastric tube tip is in the body of the stomach. Electronically Signed   By: Darliss Cheney M.D.   On: 06/23/2022 16:08   CT ABDOMEN PELVIS W CONTRAST  Result Date: 06/23/2022 CLINICAL DATA:  Acute onset abdominal pain EXAM: CT ABDOMEN AND PELVIS WITH CONTRAST TECHNIQUE: Multidetector CT imaging of the abdomen and pelvis was performed using the standard protocol following bolus administration of intravenous contrast. RADIATION DOSE REDUCTION: This exam was performed according to the departmental dose-optimization program which includes automated exposure control, adjustment of the mA and/or kV according to patient size and/or use of  iterative reconstruction technique. CONTRAST:  75mL OMNIPAQUE IOHEXOL 300 MG/ML  SOLN COMPARISON:  CT abdomen and pelvis dated 09/03/2020 FINDINGS: Lower chest: No focal consolidation or pulmonary nodule in the lung bases. No pleural effusion or pneumothorax demonstrated. Partially imaged heart size is normal. Hepatobiliary: No focal hepatic lesions. No intra or extrahepatic biliary ductal dilation. Normal gallbladder.  Pancreas: No focal lesions or main ductal dilation. Spleen: Normal in size without focal abnormality. Adrenals/Urinary Tract: No adrenal nodules. No suspicious renal mass, calculi or hydronephrosis. No focal bladder wall thickening. Stomach/Bowel: Distention of the stomach. Postsurgical changes from right lower quadrant small bowel resection and anastomosis. Dilated small bowel upstream of the anastomosis. Appendix is not discretely seen. Vascular/Lymphatic: Aortic atherosclerosis. No enlarged abdominal or pelvic lymph nodes. Reproductive: No adnexal masses. Other: Small volume pelvic free fluid. No free air or fluid collection. Musculoskeletal: No acute or abnormal lytic or blastic osseous lesions. IMPRESSION: 1. Postsurgical changes from right lower quadrant small bowel resection and anastomosis. Dilated small bowel upstream of the anastomosis, suspicious for partial small bowel obstruction. Distal small bowel is not completely decompressed. 2. Small volume pelvic free fluid. No free air or fluid collection. 3.  Aortic Atherosclerosis (ICD10-I70.0). Electronically Signed   By: Agustin Cree M.D.   On: 06/23/2022 14:57     Medical Consultants:   None.   Subjective:    Tammy Page denies any abdominal pain passing gas has not had a bowel movement.  Objective:    Vitals:   06/23/22 1729 06/23/22 2110 06/24/22 0121 06/24/22 0549  BP: (!) 156/81 (!) 142/71 132/61 135/83  Pulse: 60 62 65 72  Resp: 17 16 16 16   Temp: 98 F (36.7 C) 98.2 F (36.8 C) (!) 97.4 F (36.3 C) 98.2 F (36.8 C)  TempSrc: Oral Oral Oral Oral  SpO2: 99% 97% 97% 95%  Weight:      Height:       SpO2: 95 %   Intake/Output Summary (Last 24 hours) at 06/24/2022 0839 Last data filed at 06/23/2022 1837 Gross per 24 hour  Intake 555.57 ml  Output --  Net 555.57 ml   Filed Weights   06/23/22 1322  Weight: 71.7 kg    Exam: General exam: In no acute distress. Respiratory system: Good air movement and clear to  auscultation. Cardiovascular system: S1 & S2 heard, RRR. No JVD. Gastrointestinal system: Abdomen is nondistended, soft and nontender.  Extremities: No pedal edema. Skin: No rashes, lesions or ulcers Psychiatry: Judgement and insight appear normal. Mood & affect appropriate.    Data Reviewed:    Labs: Basic Metabolic Panel: Recent Labs  Lab 06/23/22 1341 06/23/22 1822 06/24/22 0524  NA 136  --  135  K 3.9  --  3.6  CL 102  --  101  CO2 23  --  25  GLUCOSE 106*  --  88  BUN 14  --  11  CREATININE 1.08* 1.08* 1.24*  CALCIUM 10.1  --  9.2   GFR Estimated Creatinine Clearance: 29.8 mL/min (A) (by C-G formula based on SCr of 1.24 mg/dL (H)). Liver Function Tests: Recent Labs  Lab 06/23/22 1341 06/24/22 0524  AST 21 16  ALT 13 14  ALKPHOS 60 54  BILITOT 0.7 1.1  PROT 7.8 6.1*  ALBUMIN 4.3 3.4*   Recent Labs  Lab 06/23/22 1341  LIPASE 28   No results for input(s): "AMMONIA" in the last 168 hours. Coagulation profile No results for input(s): "INR", "PROTIME" in the last 168  hours. COVID-19 Labs  No results for input(s): "DDIMER", "FERRITIN", "LDH", "CRP" in the last 72 hours.  Lab Results  Component Value Date   SARSCOV2NAA NEGATIVE 09/03/2020   SARSCOV2NAA Not Detected 02/17/2019   SARSCOV2NAA Not Detected 09/01/2018    CBC: Recent Labs  Lab 06/23/22 1341 06/23/22 1822 06/24/22 0524  WBC 6.4 6.5 6.5  HGB 13.3 12.9 12.3  HCT 41.1 40.3 37.9  MCV 92.4 92.2 92.9  PLT 185 202 193   Cardiac Enzymes: No results for input(s): "CKTOTAL", "CKMB", "CKMBINDEX", "TROPONINI" in the last 168 hours. BNP (last 3 results) No results for input(s): "PROBNP" in the last 8760 hours. CBG: No results for input(s): "GLUCAP" in the last 168 hours. D-Dimer: No results for input(s): "DDIMER" in the last 72 hours. Hgb A1c: No results for input(s): "HGBA1C" in the last 72 hours. Lipid Profile: No results for input(s): "CHOL", "HDL", "LDLCALC", "TRIG", "CHOLHDL",  "LDLDIRECT" in the last 72 hours. Thyroid function studies: No results for input(s): "TSH", "T4TOTAL", "T3FREE", "THYROIDAB" in the last 72 hours.  Invalid input(s): "FREET3" Anemia work up: No results for input(s): "VITAMINB12", "FOLATE", "FERRITIN", "TIBC", "IRON", "RETICCTPCT" in the last 72 hours. Sepsis Labs: Recent Labs  Lab 06/23/22 1341 06/23/22 1822 06/24/22 0524  WBC 6.4 6.5 6.5   Microbiology No results found for this or any previous visit (from the past 240 hour(s)).   Medications:    enoxaparin (LOVENOX) injection  40 mg Subcutaneous Q24H   [START ON 06/29/2022] levothyroxine  75 mcg Intravenous Daily   pantoprazole (PROTONIX) IV  40 mg Intravenous Q24H   Continuous Infusions:  lactated ringers 100 mL/hr at 06/23/22 1813      LOS: 1 day   Marinda Elk  [facility]  06/24/2022, 8:39 AM

## 2022-06-25 DIAGNOSIS — K56609 Unspecified intestinal obstruction, unspecified as to partial versus complete obstruction: Secondary | ICD-10-CM | POA: Diagnosis not present

## 2022-06-25 NOTE — TOC Initial Note (Addendum)
Transition of Care [facility]) - Initial/Assessment Note    Patient Details  Name: Tammy Page MRN: [id number] Date of Birth: 08-Nov-1932  Transition of Care [facility]) CM/SW Contact:    Durenda Guthrie, RN Phone Number: 06/25/2022, 11:41 AM  Clinical Narrative:                 Case manager spoke with patient concerning recommendation for Home Health therapy. Patient states she was independent prior to adm and wants to get back to that. Discussed options for Home Health agencies, patient has no preference, referral was called to [facility], Loren[facility] Chick. No DME needs. Patient requested that CM contact her daughter to update her on [facility] plan.  1154: CM spoke with patient's daughter, Aalliyah Ricketts [phone], she requested that [facility] contact her to arrange visits. CM contacted [facility] Liaison with request.   Expected Discharge Plan: Home w Home Health Services Barriers to Discharge: No Barriers Identified   Patient Goals and CMS Choice Patient states their goals for this hospitalization and ongoing recovery are:: get stronger   Choice offered to / list presented to : Patient      Expected Discharge Plan and Services   Discharge Planning Services: CM Consult Post Acute Care Choice: Home Health Living arrangements for the past 2 months: Single Family Home                 DME Arranged: N/A DME Agency: NA       HH Arranged: PT, Nurse's Aide HH Agency: [facility] Date [facility] Agency Contacted: 06/25/22 Time HH Agency Contacted: 1135 Representative spoke with at [facility] Agency: Lorenza Chick  Prior Living Arrangements/Services Living arrangements for the past 2 months: Single Family Home Lives with:: Self Patient language and need for interpreter reviewed:: Yes Do you feel safe going back to the place where you live?: Yes      Need for Family Participation in Patient Care: Yes (Comment) Care giver support system in place?: Yes (comment)   Criminal Activity/Legal  Involvement Pertinent to Current Situation/Hospitalization: No - Comment as needed  Activities of Daily Living Home Assistive Devices/Equipment: None ADL Screening (condition at time of admission) Patient's cognitive ability adequate to safely complete daily activities?: Yes Is the patient deaf or have difficulty hearing?: Yes Does the patient have difficulty seeing, even when wearing glasses/contacts?: Yes Does the patient have difficulty concentrating, remembering, or making decisions?: No Patient able to express need for assistance with ADLs?: Yes Does the patient have difficulty dressing or bathing?: No Independently performs ADLs?: Yes (appropriate for developmental age) Does the patient have difficulty walking or climbing stairs?: No Weakness of Legs: None Weakness of Arms/Hands: None  Permission Sought/Granted Permission sought to share information with : Case Manager, Family Supports       Permission granted to share info w AGENCY: [facility] - [facility]  Permission granted to share info w Relationship: Daughter - Dola Factor     Emotional Assessment   Attitude/Demeanor/Rapport: Gracious   Orientation: : Oriented to Self, Oriented to Place, Oriented to  Time, Oriented to Situation Alcohol / Substance Use: Not Applicable Psych Involvement: No (comment)  Admission diagnosis:  Small bowel obstruction (HCC) [K56.609] Partial small bowel obstruction (HCC) [K56.600] Patient Active Problem List   Diagnosis Date Noted   Small bowel obstruction (HCC) 06/23/2022   HTN (hypertension) 06/23/2022   Hypothyroidism 06/23/2022   GERD (gastroesophageal reflux disease) 06/23/2022   Internal hernia 09/03/2020   S/P exploratory laparotomy 09/03/2020  Varicose veins of right lower extremity with complications 07/08/2016   Complete tear of right rotator cuff 04/07/2012    Class: Acute   Hyperthyroidism 01/29/2011   Anticoagulation management encounter 01/29/2011   PAF  (paroxysmal atrial fibrillation) (HCC) 01/29/2011   PCP:  Michela Pitcher, MD Pharmacy:   [facility] [phone] - [address], [place] - [address] [place] [address] Phone: [phone] Fax: [phone]     Social Determinants of Health (SDOH) Social History: SDOH Screenings   Food Insecurity: No Food Insecurity (06/23/2022)  Housing: Low Risk  (06/23/2022)  Transportation Needs: No Transportation Needs (06/23/2022)  Utilities: Not At Risk (06/23/2022)  Tobacco Use: Low Risk  (06/23/2022)   SDOH Interventions:     Readmission Risk Interventions     No data to display

## 2022-06-25 NOTE — Plan of Care (Signed)
D/C orders received. Pt tolerating soft diet with no n/v. Reviewed dc instructions.

## 2022-06-25 NOTE — Discharge Summary (Signed)
Physician Discharge Summary  Tammy Page:[id number] DOB: [birth year] DOA: 06/23/2022  PCP: Michela Pitcher, MD  Admit date: 06/23/2022 Discharge date: 06/25/2022  Admitted From: home Disposition:  home  Recommendations for Outpatient Follow-up:  Follow up with PCP in 1-2 weeks Please obtain BMP/CBC in one week  Home Health: PT Equipment/Devices: none  Discharge Condition: stable CODE STATUS: Full code  HPI: Per admitting MD, Tammy Page is a 89 y.o. female with medical history significant of prior bowel obstruction requiring laparotomy/small bowel resection 2022, HTN, GERD, hypothyroidism who presented with abdominal pain. Per patient-abdominal pain started around 3 AM-it actually woke her up from sleep.  Pain was described as colicky in nature, 10/10 at its severity.  This was associated with at least 2 episodes of nonbloody/nonbilious vomiting.  She has not had a BM today-has had small-volume flatus occasionally throughout the course of the day. [facility] she was found to have bowel obstruction on CT imaging.  NG tube was placed, general surgery was consulted-and patient was then transferred to [facility] for further evaluation. No history of fever, headache, chest pain, shortness of breath.  No history of diarrhea.  No history of hematochezia or melena.  No history of hematuria.  Hospital Course / Discharge diagnoses: Principal Problem:   Small bowel obstruction (HCC) Active Problems:   HTN (hypertension)   Hypothyroidism   GERD (gastroesophageal reflux disease)  Principal problem Small bowel obstruction -General surgery was consulted, she was treated conservatively with n.p.o., NG tube, IV fluid.  She was started on small bowel protocol.  Eventually her small bowel improved with conservative management, NG tube was discontinued, her diet was advanced and she is tolerating regular diet.  She started having bowel movements.   With improvement, she will be discharged home in stable condition  Active problems CKD 3a  -baseline creatinine varies between 0.9 and 1.2.  Currently at baseline Essential hypertension -Resume home medications on discharge   Hypothyroidism - Continue Synthroid    Sepsis ruled out   Discharge Instructions   Allergies as of 06/25/2022   No Known Allergies      Medication List     STOP taking these medications    HYDROcodone-acetaminophen 5-325 MG tablet Commonly known as: NORCO/VICODIN       TAKE these medications    aspirin 81 MG tablet Take 81 mg by mouth every morning.   Azelastine HCl 137 MCG/SPRAY Soln Place 2 sprays into both nostrils 2 (two) times daily.   Azopt 1 % ophthalmic suspension Generic drug: brinzolamide Place 1 drop into both eyes 3 (three) times daily.   diclofenac sodium 1 % Gel Commonly known as: VOLTAREN Apply 2 g topically See admin instructions. Apply to affected areas in the morning and evening   FIBER ADULT GUMMIES PO Take 2 tablets by mouth daily.   Hair/Skin/Nails/Biotin Tabs Take 2 tablets by mouth every morning.   levothyroxine 100 MCG tablet Commonly known as: SYNTHROID Take 100 mcg by mouth daily before breakfast.   lisinopril-hydrochlorothiazide 10-12.5 MG tablet Commonly known as: ZESTORETIC Take 1 tablet by mouth every morning.   Melatonin 5 MG Chew Chew 5 mg by mouth at bedtime.   Restasis 0.05 % ophthalmic emulsion Generic drug: cycloSPORINE Place 1 drop into both eyes in the morning and at bedtime.   Vitamin D (Ergocalciferol) 1.25 MG (50000 UNIT) Caps capsule Commonly known as: DRISDOL Take 50,000 Units by mouth every Monday.   Vyzulta 0.024 %  Soln Generic drug: Latanoprostene Bunod Place 1 drop into both eyes at bedtime.        Follow-up Information     [facility], [facility] Follow up.   Specialty: Home Health Services Why: someone from [facility] will contact you to arrange start date and  time for your therapy Contact information: [address] [place] [address] [phone]                 Consultations: General surgery   Procedures/Studies:  DG Abd Portable 1V-Small Bowel Obstruction Protocol-initial, 8 hr delay  Result Date: 06/24/2022 CLINICAL DATA:  Small bowel obstruction, 8 hour delay. EXAM: PORTABLE ABDOMEN - 1 VIEW COMPARISON:  Radiograph and CT yesterday FINDINGS: Administered enteric contrast is seen throughout the colon to the level of the rectum. There is mild gaseous small bowel distension centrally. Enteric tube is partially included in the field of view. IMPRESSION: Administered enteric contrast throughout the colon to the rectum, consistent with either partial or resolving obstruction. Electronically Signed   By: Narda Rutherford M.D.   On: 06/24/2022 22:19   DG Abd Portable 1 View  Result Date: 06/23/2022 CLINICAL DATA:  NG tube confirmation EXAM: PORTABLE ABDOMEN - 1 VIEW COMPARISON:  None Available. FINDINGS: Nasogastric tube tip is in the body of the stomach. The bowel gas pattern is normal. No radio-opaque calculi or other significant radiographic abnormality are seen. IMPRESSION: Nasogastric tube tip is in the body of the stomach. Electronically Signed   By: Darliss Cheney M.D.   On: 06/23/2022 16:08   CT ABDOMEN PELVIS W CONTRAST  Result Date: 06/23/2022 CLINICAL DATA:  Acute onset abdominal pain EXAM: CT ABDOMEN AND PELVIS WITH CONTRAST TECHNIQUE: Multidetector CT imaging of the abdomen and pelvis was performed using the standard protocol following bolus administration of intravenous contrast. RADIATION DOSE REDUCTION: This exam was performed according to the departmental dose-optimization program which includes automated exposure control, adjustment of the mA and/or kV according to patient size and/or use of iterative reconstruction technique. CONTRAST:  75mL OMNIPAQUE IOHEXOL 300 MG/ML  SOLN COMPARISON:  CT abdomen and pelvis dated  09/03/2020 FINDINGS: Lower chest: No focal consolidation or pulmonary nodule in the lung bases. No pleural effusion or pneumothorax demonstrated. Partially imaged heart size is normal. Hepatobiliary: No focal hepatic lesions. No intra or extrahepatic biliary ductal dilation. Normal gallbladder. Pancreas: No focal lesions or main ductal dilation. Spleen: Normal in size without focal abnormality. Adrenals/Urinary Tract: No adrenal nodules. No suspicious renal mass, calculi or hydronephrosis. No focal bladder wall thickening. Stomach/Bowel: Distention of the stomach. Postsurgical changes from right lower quadrant small bowel resection and anastomosis. Dilated small bowel upstream of the anastomosis. Appendix is not discretely seen. Vascular/Lymphatic: Aortic atherosclerosis. No enlarged abdominal or pelvic lymph nodes. Reproductive: No adnexal masses. Other: Small volume pelvic free fluid. No free air or fluid collection. Musculoskeletal: No acute or abnormal lytic or blastic osseous lesions. IMPRESSION: 1. Postsurgical changes from right lower quadrant small bowel resection and anastomosis. Dilated small bowel upstream of the anastomosis, suspicious for partial small bowel obstruction. Distal small bowel is not completely decompressed. 2. Small volume pelvic free fluid. No free air or fluid collection. 3.  Aortic Atherosclerosis (ICD10-I70.0). Electronically Signed   By: Agustin Cree M.D.   On: 06/23/2022 14:57     Subjective: - no chest pain, shortness of breath, no abdominal pain, nausea or vomiting.   Discharge Exam: BP (!) 128/56 (BP Location: Right Arm)   Pulse 69  Temp 98.6 F (37 C) (Oral)   Resp 18   Ht 5\' 3"  (1.6 m)   Wt 71.7 kg   SpO2 95%   BMI 27.99 kg/m   General: Pt is alert, awake, not in acute distress Cardiovascular: RRR, S1/S2 +, no rubs, no gallops Respiratory: CTA bilaterally, no wheezing, no rhonchi Abdominal: Soft, NT, ND, bowel sounds + Extremities: no edema, no  cyanosis  The results of significant diagnostics from this hospitalization (including imaging, microbiology, ancillary and laboratory) are listed below for reference.     Microbiology: No results found for this or any previous visit (from the past 240 hour(s)).   Labs: Basic Metabolic Panel: Recent Labs  Lab 06/23/22 1341 06/23/22 1822 06/24/22 0524  NA 136  --  135  K 3.9  --  3.6  CL 102  --  101  CO2 23  --  25  GLUCOSE 106*  --  88  BUN 14  --  11  CREATININE 1.08* 1.08* 1.24*  CALCIUM 10.1  --  9.2   Liver Function Tests: Recent Labs  Lab 06/23/22 1341 06/24/22 0524  AST 21 16  ALT 13 14  ALKPHOS 60 54  BILITOT 0.7 1.1  PROT 7.8 6.1*  ALBUMIN 4.3 3.4*   CBC: Recent Labs  Lab 06/23/22 1341 06/23/22 1822 06/24/22 0524  WBC 6.4 6.5 6.5  HGB 13.3 12.9 12.3  HCT 41.1 40.3 37.9  MCV 92.4 92.2 92.9  PLT 185 202 193   CBG: No results for input(s): "GLUCAP" in the last 168 hours. Hgb A1c No results for input(s): "HGBA1C" in the last 72 hours. Lipid Profile No results for input(s): "CHOL", "HDL", "LDLCALC", "TRIG", "CHOLHDL", "LDLDIRECT" in the last 72 hours. Thyroid function studies No results for input(s): "TSH", "T4TOTAL", "T3FREE", "THYROIDAB" in the last 72 hours.  Invalid input(s): "FREET3" Urinalysis    Component Value Date/Time   COLORURINE YELLOW 06/23/2022 1341   APPEARANCEUR CLEAR 06/23/2022 1341   LABSPEC 1.023 06/23/2022 1341   PHURINE 7.5 06/23/2022 1341   GLUCOSEU NEGATIVE 06/23/2022 1341   HGBUR NEGATIVE 06/23/2022 1341   BILIRUBINUR NEGATIVE 06/23/2022 1341   KETONESUR 15 (A) 06/23/2022 1341   PROTEINUR TRACE (A) 06/23/2022 1341   UROBILINOGEN 0.2 04/06/2012 1050   NITRITE NEGATIVE 06/23/2022 1341   LEUKOCYTESUR NEGATIVE 06/23/2022 1341    FURTHER DISCHARGE INSTRUCTIONS:   Get Medicines reviewed and adjusted: Please take all your medications with you for your next visit with your Primary MD   Laboratory/radiological  data: Please request your Primary MD to go over all hospital tests and procedure/radiological results at the follow up, please ask your Primary MD to get all Hospital records sent to his/her office.   In some cases, they will be blood work, cultures and biopsy results pending at the time of your discharge. Please request that your primary care M.D. goes through all the records of your hospital data and follows up on these results.   Also Note the following: If you experience worsening of your admission symptoms, develop shortness of breath, life threatening emergency, suicidal or homicidal thoughts you must seek medical attention immediately by calling 911 or calling your MD immediately  if symptoms less severe.   You must read complete instructions/literature along with all the possible adverse reactions/side effects for all the Medicines you take and that have been prescribed to you. Take any new Medicines after you have completely understood and accpet all the possible adverse reactions/side effects.    Do not drive  when taking Pain medications or sleeping medications (Benzodaizepines)   Do not take more than prescribed Pain, Sleep and Anxiety Medications. It is not advisable to combine anxiety,sleep and pain medications without talking with your primary care practitioner   Special Instructions: If you have smoked or chewed Tobacco  in the last 2 yrs please stop smoking, stop any regular Alcohol  and or any Recreational drug use.   Wear Seat belts while driving.   Please note: You were cared for by a hospitalist during your hospital stay. Once you are discharged, your primary care physician will handle any further medical issues. Please note that NO REFILLS for any discharge medications will be authorized once you are discharged, as it is imperative that you return to your primary care physician (or establish a relationship with a primary care physician if you do not have one) for your post  hospital discharge needs so that they can reassess your need for medications and monitor your lab values.  Time coordinating discharge: 35 minutes  SIGNED:  Pamella Pert, MD, PhD 06/25/2022, 2:33 PM

## 2022-06-25 NOTE — Progress Notes (Signed)
[facility] Progress Note     Subjective: CC:  Feels much better. Having bowel movements. Walking in the hall. Reports feeling hungry  Objective: Vital signs in last 24 hours: Temp:  [98.3 F (36.8 C)-98.6 F (37 C)] 98.6 F (37 C) (05/08 0533) Pulse Rate:  [63-91] 69 (05/08 0533) Resp:  [17-18] 18 (05/08 0533) BP: (127-131)/(56-81) 128/56 (05/08 0533) SpO2:  [94 %-98 %] 95 % (05/08 0533) Last BM Date : 06/25/22  Intake/Output from previous day: No intake/output data recorded. Intake/Output this shift: No intake/output data recorded.  PE: Gen:  Alert, NAD, pleasant Card:  Regular rate and rhythm Pulm:  Normal effort ORA Abd: Soft, non-tender, non-distended, +BS Skin: warm and dry, no rashes  Psych: A&Ox3   Lab Results:  Recent Labs    06/23/22 1822 06/24/22 0524  WBC 6.5 6.5  HGB 12.9 12.3  HCT 40.3 37.9  PLT 202 193   BMET Recent Labs    06/23/22 1341 06/23/22 1822 06/24/22 0524  NA 136  --  135  K 3.9  --  3.6  CL 102  --  101  CO2 23  --  25  GLUCOSE 106*  --  88  BUN 14  --  11  CREATININE 1.08* 1.08* 1.24*  CALCIUM 10.1  --  9.2   PT/INR No results for input(s): "LABPROT", "INR" in the last 72 hours. CMP     Component Value Date/Time   NA 135 06/24/2022 0524   K 3.6 06/24/2022 0524   CL 101 06/24/2022 0524   CO2 25 06/24/2022 0524   GLUCOSE 88 06/24/2022 0524   BUN 11 06/24/2022 0524   CREATININE 1.24 (H) 06/24/2022 0524   CALCIUM 9.2 06/24/2022 0524   PROT 6.1 (L) 06/24/2022 0524   ALBUMIN 3.4 (L) 06/24/2022 0524   AST 16 06/24/2022 0524   ALT 14 06/24/2022 0524   ALKPHOS 54 06/24/2022 0524   BILITOT 1.1 06/24/2022 0524   GFRNONAA 42 (L) 06/24/2022 0524   GFRAA 59 (L) 04/06/2012 1055   Lipase     Component Value Date/Time   LIPASE 28 06/23/2022 1341       Studies/Results: DG Abd Portable 1V-Small Bowel Obstruction Protocol-initial, 8 hr delay  Result Date: 06/24/2022 CLINICAL DATA:  Small bowel obstruction,  8 hour delay. EXAM: PORTABLE ABDOMEN - 1 VIEW COMPARISON:  Radiograph and CT yesterday FINDINGS: Administered enteric contrast is seen throughout the colon to the level of the rectum. There is mild gaseous small bowel distension centrally. Enteric tube is partially included in the field of view. IMPRESSION: Administered enteric contrast throughout the colon to the rectum, consistent with either partial or resolving obstruction. Electronically Signed   By: Narda Rutherford M.D.   On: 06/24/2022 22:19   DG Abd Portable 1 View  Result Date: 06/23/2022 CLINICAL DATA:  NG tube confirmation EXAM: PORTABLE ABDOMEN - 1 VIEW COMPARISON:  None Available. FINDINGS: Nasogastric tube tip is in the body of the stomach. The bowel gas pattern is normal. No radio-opaque calculi or other significant radiographic abnormality are seen. IMPRESSION: Nasogastric tube tip is in the body of the stomach. Electronically Signed   By: Darliss Cheney M.D.   On: 06/23/2022 16:08   CT ABDOMEN PELVIS W CONTRAST  Result Date: 06/23/2022 CLINICAL DATA:  Acute onset abdominal pain EXAM: CT ABDOMEN AND PELVIS WITH CONTRAST TECHNIQUE: Multidetector CT imaging of the abdomen and pelvis was performed using the standard protocol following bolus administration of intravenous contrast. RADIATION DOSE REDUCTION:  This exam was performed according to the departmental dose-optimization program which includes automated exposure control, adjustment of the mA and/or kV according to patient size and/or use of iterative reconstruction technique. CONTRAST:  75mL OMNIPAQUE IOHEXOL 300 MG/ML  SOLN COMPARISON:  CT abdomen and pelvis dated 09/03/2020 FINDINGS: Lower chest: No focal consolidation or pulmonary nodule in the lung bases. No pleural effusion or pneumothorax demonstrated. Partially imaged heart size is normal. Hepatobiliary: No focal hepatic lesions. No intra or extrahepatic biliary ductal dilation. Normal gallbladder. Pancreas: No focal lesions or main  ductal dilation. Spleen: Normal in size without focal abnormality. Adrenals/Urinary Tract: No adrenal nodules. No suspicious renal mass, calculi or hydronephrosis. No focal bladder wall thickening. Stomach/Bowel: Distention of the stomach. Postsurgical changes from right lower quadrant small bowel resection and anastomosis. Dilated small bowel upstream of the anastomosis. Appendix is not discretely seen. Vascular/Lymphatic: Aortic atherosclerosis. No enlarged abdominal or pelvic lymph nodes. Reproductive: No adnexal masses. Other: Small volume pelvic free fluid. No free air or fluid collection. Musculoskeletal: No acute or abnormal lytic or blastic osseous lesions. IMPRESSION: 1. Postsurgical changes from right lower quadrant small bowel resection and anastomosis. Dilated small bowel upstream of the anastomosis, suspicious for partial small bowel obstruction. Distal small bowel is not completely decompressed. 2. Small volume pelvic free fluid. No free air or fluid collection. 3.  Aortic Atherosclerosis (ICD10-I70.0). Electronically Signed   By: Agustin Cree M.D.   On: 06/23/2022 14:57    Anti-infectives: Anti-infectives (From admission, onward)    None        Assessment/Plan  SBO, history of multiple abdominal surgeries SBO protocol 5/7 >> contrast in the colon and return of bowel function. FLD, ADAT SOFT. Ok for discharge this afternoon if tolerating PO without signs of intestinal obstruction.      LOS: 2 days   I reviewed nursing notes, hospitalist notes, last 24 h vitals and pain scores, last 48 h intake and output, last 24 h labs and trends, and last 24 h imaging results.  This care required straight-forward level of medical decision making.   Hosie Spangle, PA-C [facility] Please see Amion for pager number during day hours 7:00am-4:30pm

## 2022-12-18 IMAGING — MG MM DIGITAL DIAGNOSTIC UNILAT*L* W/ TOMO W/ CAD
4 series · 4 of 12 positions shown · non-contrast
Comparison: Previous exam(s).

CLINICAL DATA: Patient returns from screening for evaluation of
possible LEFT breast mass.

EXAM:
DIGITAL DIAGNOSTIC UNILATERAL LEFT MAMMOGRAM WITH TOMOSYNTHESIS AND
CAD; ULTRASOUND LEFT BREAST LIMITED
TECHNIQUE: Left digital diagnostic mammography and breast tomosynthesis was
performed. The images were evaluated with computer-aided detection.;
Targeted ultrasound examination of the left breast was performed.

[L CC synth-2D]
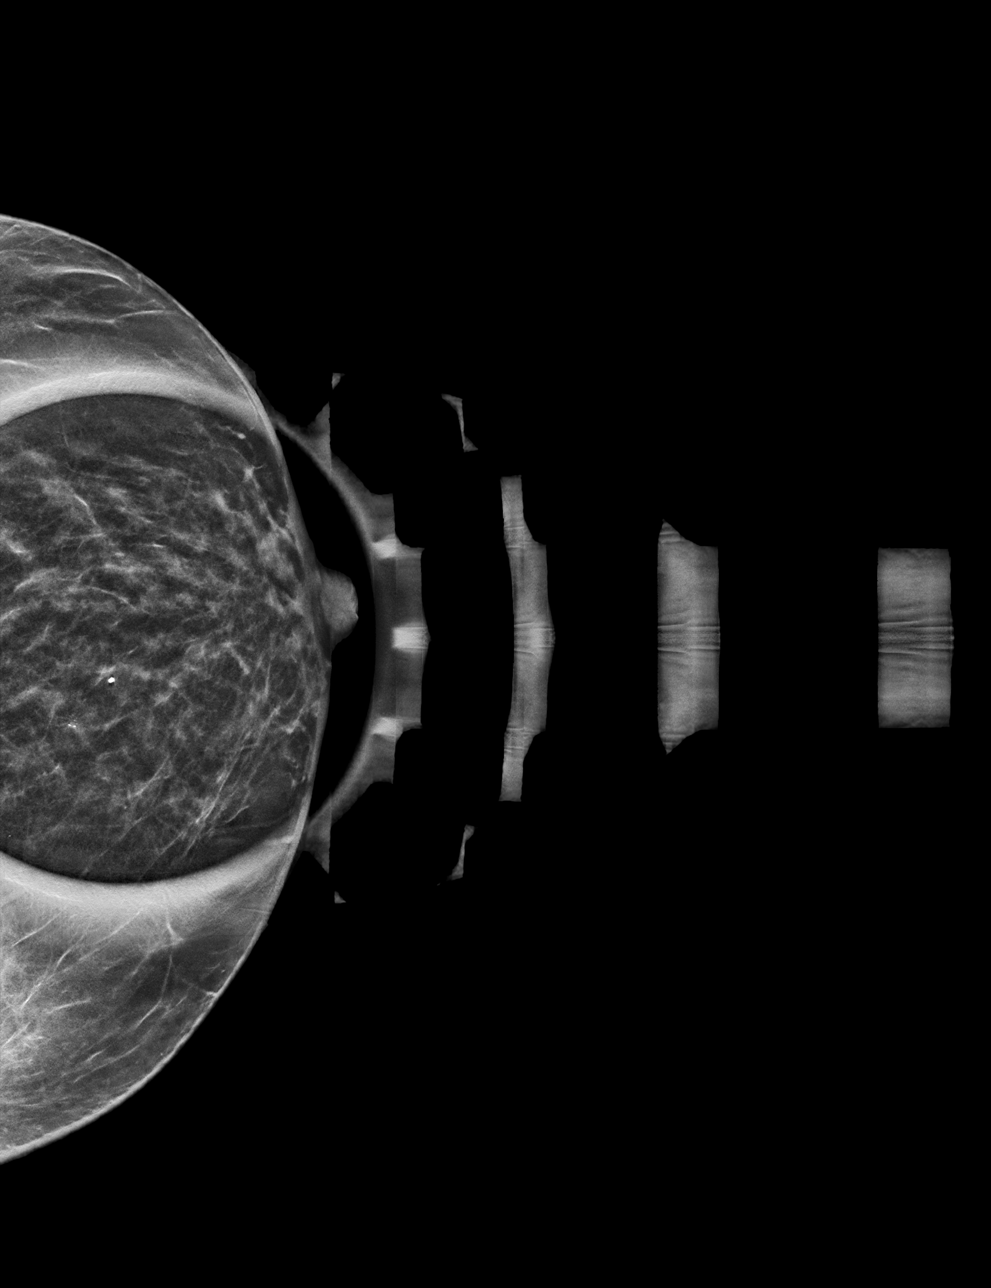

[L MLO synth-2D]
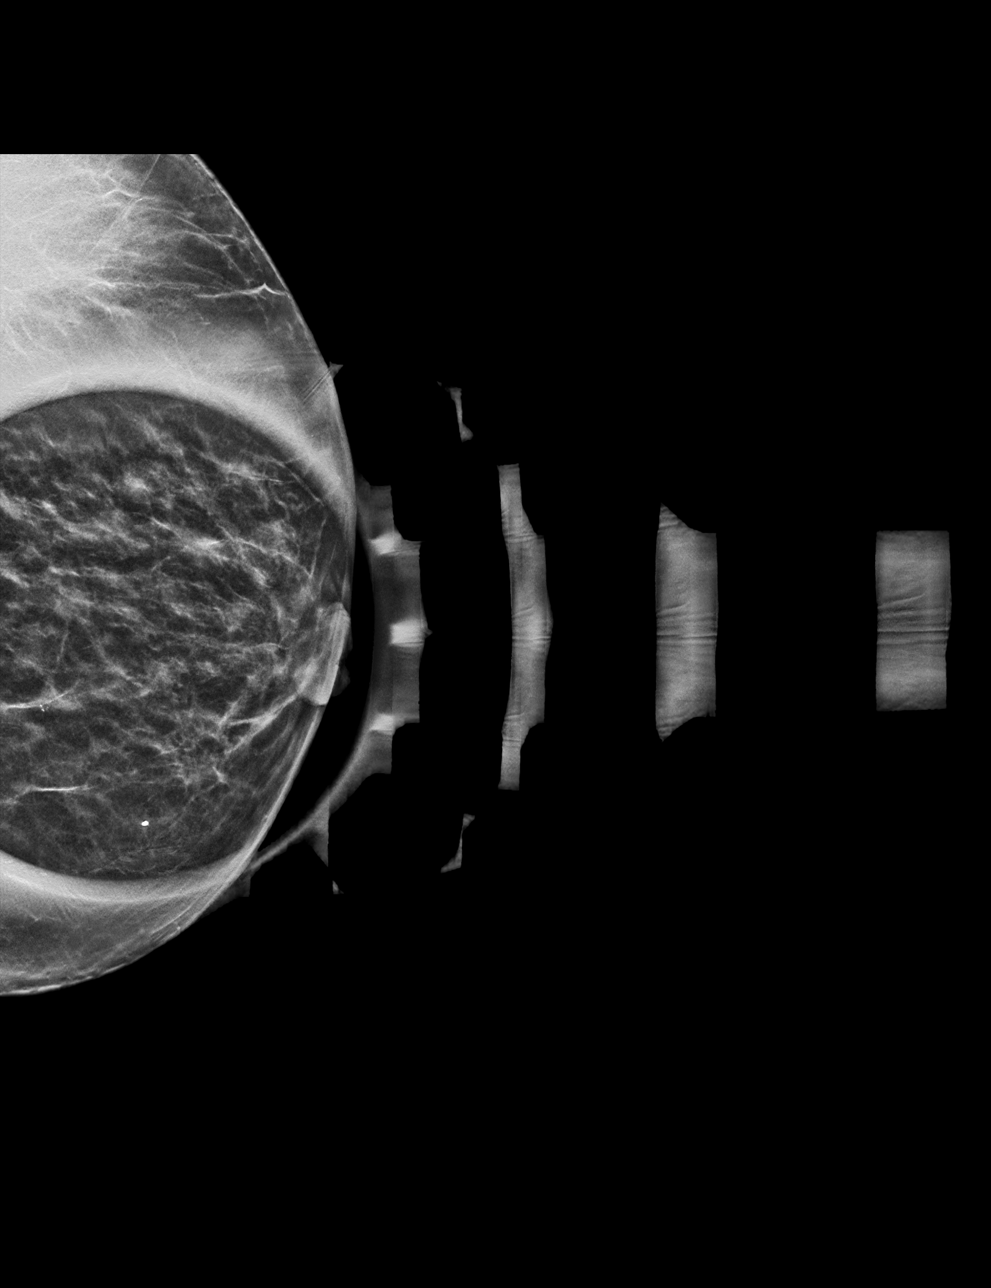

[L MLO tomo · tomo slice 21/41.0]
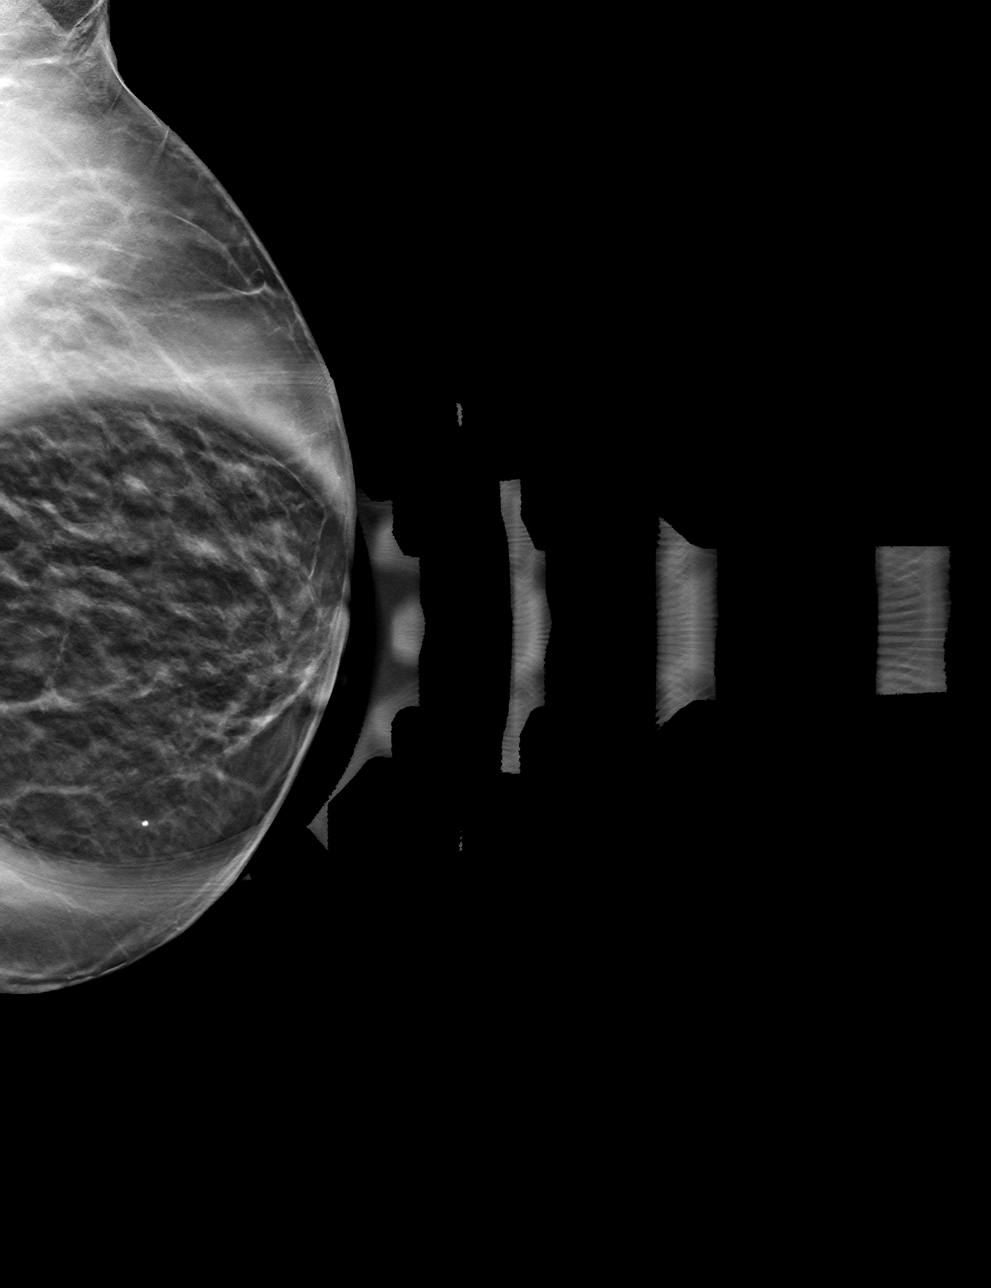

[L CC tomo · tomo slice 21/41.0]
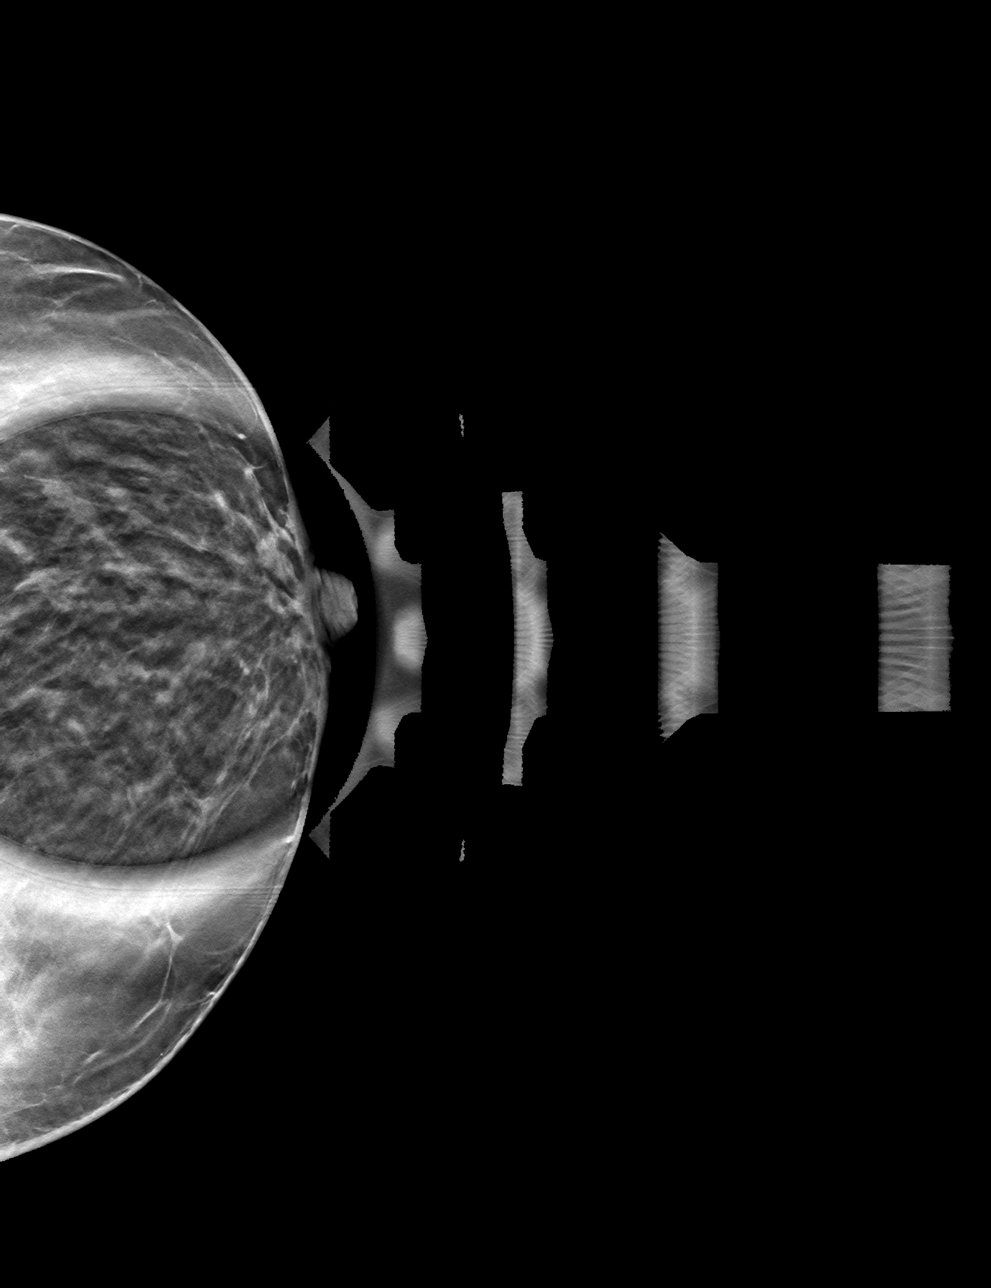

[4 of 12 positions shown; findings below may reference images not displayed]

ACR Breast Density Category c: The breast tissue is heterogeneously
dense, which may obscure small masses.
FINDINGS: Additional 2-D and 3-D images are performed. These views confirm
presence of irregular mass just LATERAL to the LEFT nipple.

Targeted ultrasound is performed, showing a mildly prominent duct in
the 3 o'clock retroareolar region of the LEFT breast correlating
well with the mammographic appearance. No intraductal mass
identified.
IMPRESSION: No mammographic or ultrasound evidence for malignancy.

RECOMMENDATION:
Screening mammogram in one year.(Code:[id number])

I have discussed the findings and recommendations with the patient.
If applicable, a reminder letter will be sent to the patient
regarding the next appointment.

BI-RADS CATEGORY  1: Negative.

## 2022-12-18 IMAGING — US US BREAST*L* LIMITED INC AXILLA
1 series · 10 of 10 positions shown · non-contrast
Comparison: Previous exam(s).

CLINICAL DATA: Patient returns from screening for evaluation of
possible LEFT breast mass.

EXAM:
DIGITAL DIAGNOSTIC UNILATERAL LEFT MAMMOGRAM WITH TOMOSYNTHESIS AND
CAD; ULTRASOUND LEFT BREAST LIMITED
TECHNIQUE: Left digital diagnostic mammography and breast tomosynthesis was
performed. The images were evaluated with computer-aided detection.;
Targeted ultrasound examination of the left breast was performed.

[Series 1: us breast*left* limited inc axilla · 0.04mm/px · 10 acquisitions, 10 frames shown]
[im 1/10]
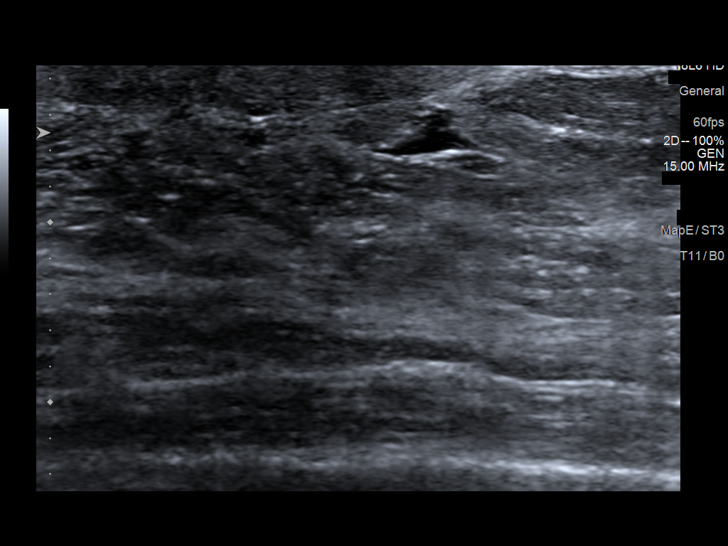
[im 2/10]
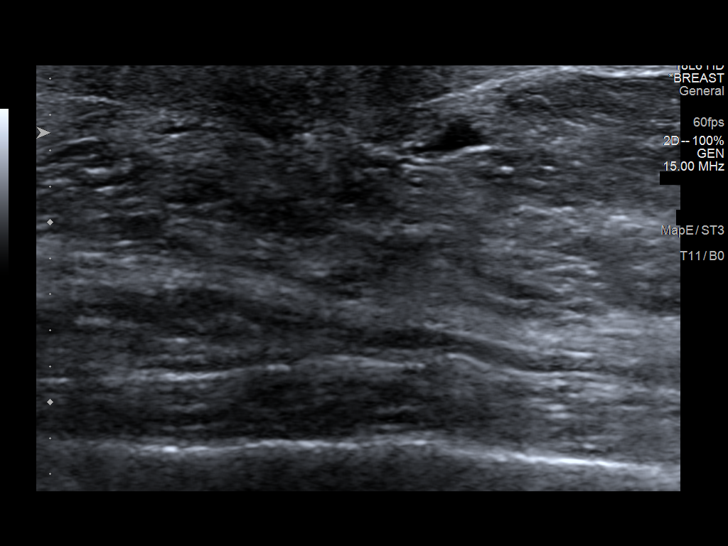
[im 3/10]
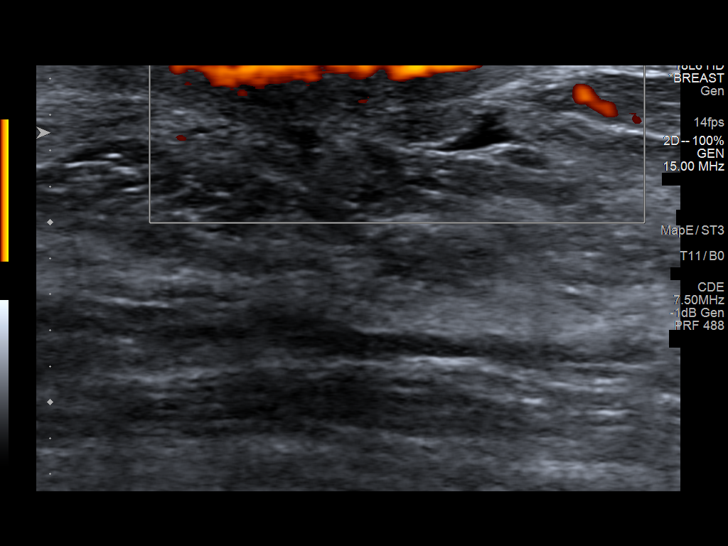
[im 4/10]
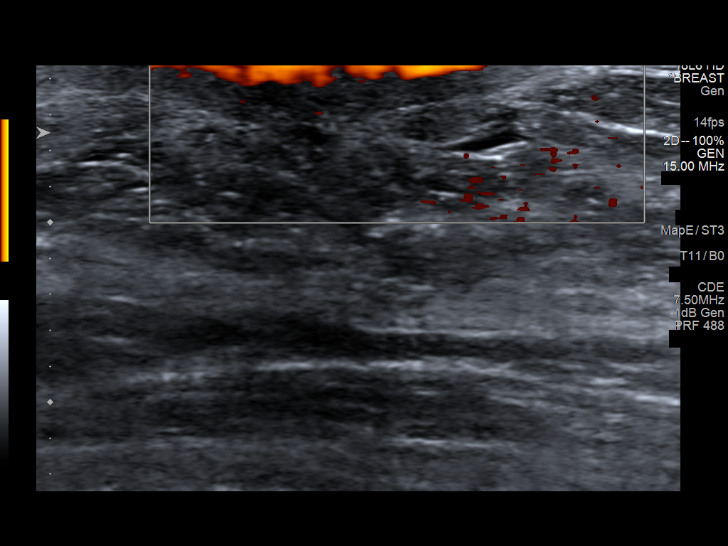
[im 5/10]
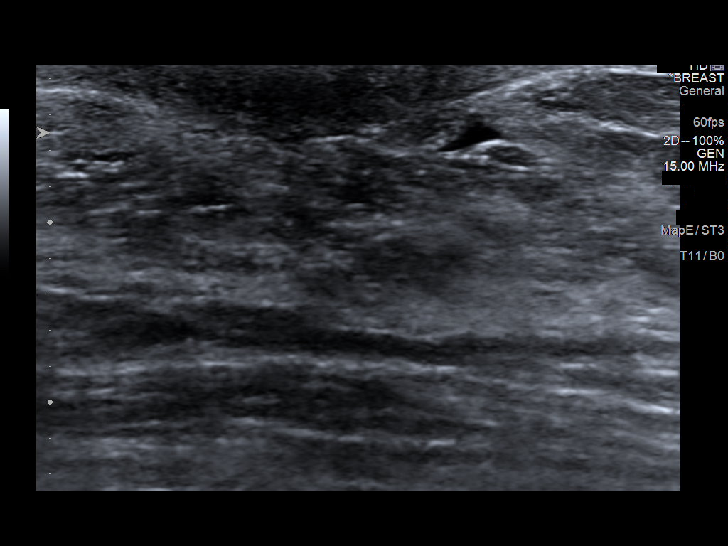
[im 6/10]
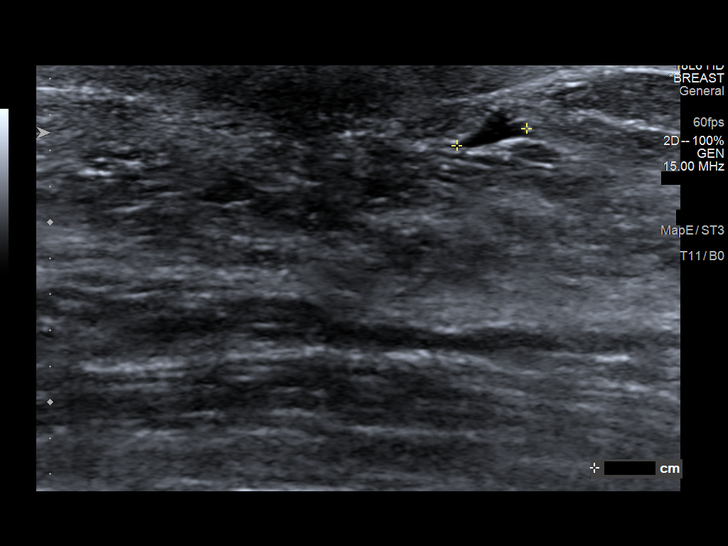
[im 7/10]
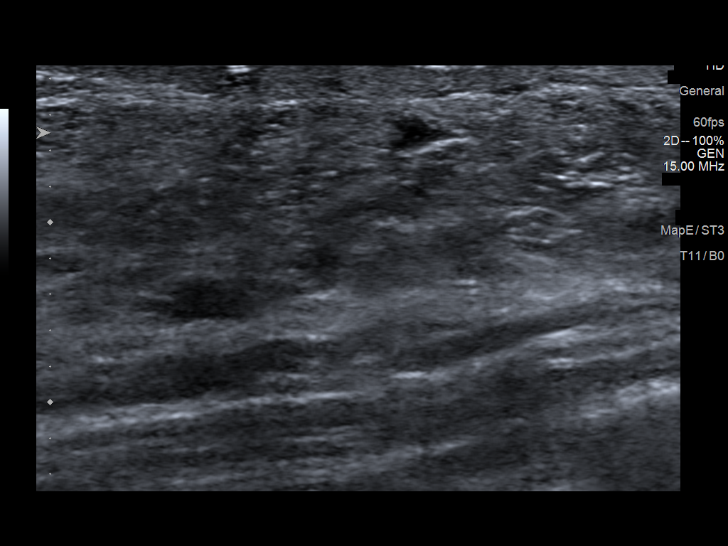
[im 8/10]
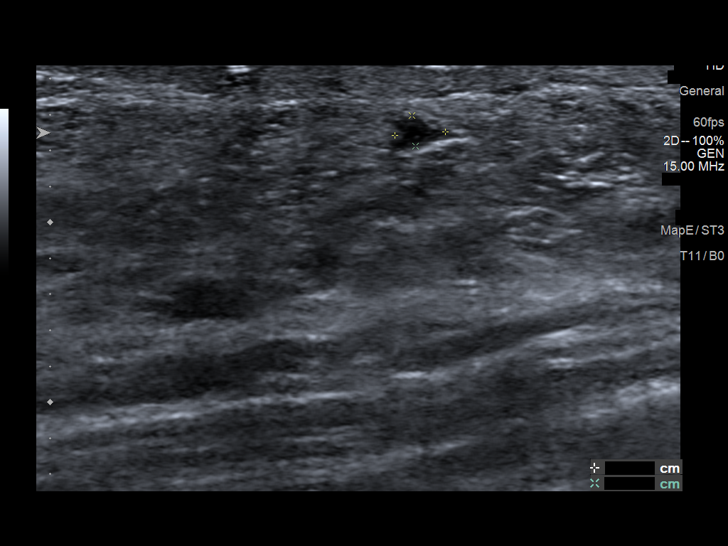
[im 9/10]
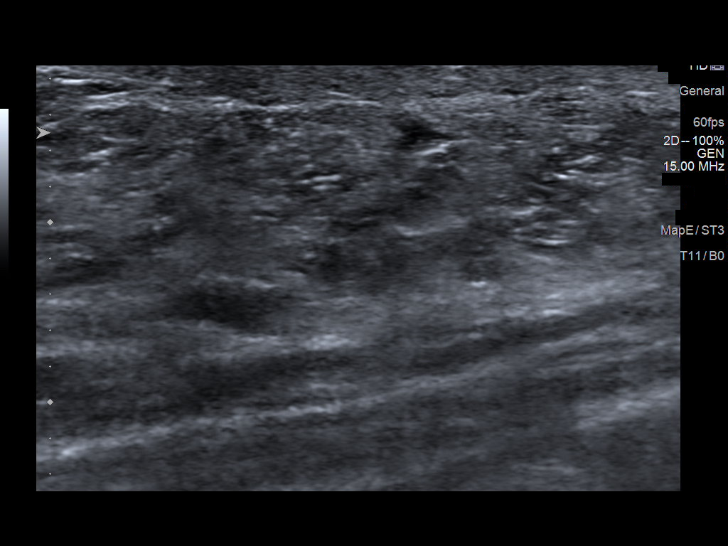
[im 10/10]
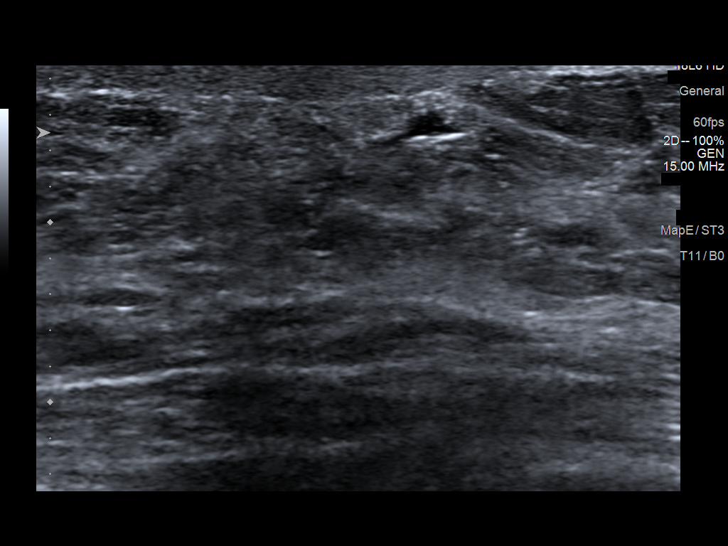

[10 of 10 positions shown; findings below may reference images not displayed]

ACR Breast Density Category c: The breast tissue is heterogeneously
dense, which may obscure small masses.
FINDINGS: Additional 2-D and 3-D images are performed. These views confirm
presence of irregular mass just LATERAL to the LEFT nipple.

Targeted ultrasound is performed, showing a mildly prominent duct in
the 3 o'clock retroareolar region of the LEFT breast correlating
well with the mammographic appearance. No intraductal mass
identified.
IMPRESSION: No mammographic or ultrasound evidence for malignancy.

RECOMMENDATION:
Screening mammogram in one year.(Code:PZ-[id number])

I have discussed the findings and recommendations with the patient.
If applicable, a reminder letter will be sent to the patient
regarding the next appointment.

BI-RADS CATEGORY  1: Negative.

## 2024-03-18 ENCOUNTER — Ambulatory Visit (INDEPENDENT_AMBULATORY_CARE_PROVIDER_SITE_OTHER): Admitting: Podiatry

## 2024-03-18 ENCOUNTER — Encounter: Payer: Self-pay | Admitting: Podiatry

## 2024-03-18 DIAGNOSIS — B351 Tinea unguium: Secondary | ICD-10-CM | POA: Diagnosis not present

## 2024-03-18 DIAGNOSIS — M79675 Pain in left toe(s): Secondary | ICD-10-CM

## 2024-03-18 DIAGNOSIS — M79674 Pain in right toe(s): Secondary | ICD-10-CM

## 2024-03-18 NOTE — Progress Notes (Signed)
 This patient presents to the office with chief complaint of long thick painful  big nails.  Patient says the nails are painful walking and wearing shoes.  This patient is unable to self treat.  This patient is unable to trim her nails since she is unable to reach her nails. She presents to the office with her daughter. She presents to the office for preventative foot care services.  General Appearance  Alert, conversant and in no acute stress.  Vascular  Dorsalis pedis are  weakly palpable  bilaterally. Posterior tibial pulses are absent  B/L. Capillary return is within normal limits  bilaterally. Temperature is within normal limits  bilaterally.  Neurologic  Senn-Weinstein monofilament wire test within normal limits  bilaterally. Muscle power within normal limits bilaterally.  Nails Thick disfigured discolored nails with subungual debris  from hallux to fifth toes bilaterally. No evidence of bacterial infection or drainage bilaterally.  Orthopedic  No limitations of motion  feet .  No crepitus or effusions noted.  No bony pathology or digital deformities noted.  Skin  normotropic skin with no porokeratosis noted bilaterally.  No signs of infections or ulcers noted.     Onychomycosis  Nails  B/L.  Pain in right toes  Pain in left toes  Debridement of nails both feet followed trimming the nails with dremel tool.    RTC 3 months.   Cordella Bold DPM
